# Patient Record
Sex: Female | Born: 1995 | Race: White | Hispanic: No | Marital: Single | Smoking: Never smoker
Health system: Southern US, Community
[De-identification: ages and names within clinical notes are randomized; demographics above are authoritative.]

## PROBLEM LIST (undated history)

## (undated) DIAGNOSIS — F32A Depression, unspecified: Secondary | ICD-10-CM

## (undated) DIAGNOSIS — Z23 Encounter for immunization: Secondary | ICD-10-CM

## (undated) DIAGNOSIS — D164 Benign neoplasm of bones of skull and face: Secondary | ICD-10-CM

## (undated) DIAGNOSIS — F419 Anxiety disorder, unspecified: Secondary | ICD-10-CM

## (undated) DIAGNOSIS — F320 Major depressive disorder, single episode, mild: Secondary | ICD-10-CM

## (undated) DIAGNOSIS — G4730 Sleep apnea, unspecified: Secondary | ICD-10-CM

## (undated) HISTORY — DX: Benign neoplasm of bones of skull and face: D16.4

## (undated) HISTORY — DX: Encounter for immunization: Z23

## (undated) HISTORY — DX: Sleep apnea, unspecified: G47.30

## (undated) HISTORY — DX: Depression, unspecified: F32.A

## (undated) HISTORY — DX: Anxiety disorder, unspecified: F41.9

## (undated) HISTORY — DX: Major depressive disorder, single episode, mild: F32.0

---
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
## 2017-01-01 DIAGNOSIS — F329 Major depressive disorder, single episode, unspecified: Secondary | ICD-10-CM | POA: Diagnosis not present

## 2017-01-12 ENCOUNTER — Telehealth: Payer: Self-pay | Admitting: Physician Assistant

## 2017-01-12 NOTE — Telephone Encounter (Signed)
FYI--Pt refused referral for sleep study

## 2017-01-12 NOTE — Telephone Encounter (Signed)
[id number] 

## 2017-02-12 DIAGNOSIS — F411 Generalized anxiety disorder: Secondary | ICD-10-CM | POA: Diagnosis not present

## 2017-02-26 DIAGNOSIS — F329 Major depressive disorder, single episode, unspecified: Secondary | ICD-10-CM | POA: Diagnosis not present

## 2017-03-10 ENCOUNTER — Encounter: Payer: Self-pay | Admitting: Physician Assistant

## 2017-03-10 ENCOUNTER — Ambulatory Visit (INDEPENDENT_AMBULATORY_CARE_PROVIDER_SITE_OTHER): Payer: Federal, State, Local not specified - PPO | Admitting: Physician Assistant

## 2017-03-10 VITALS — BP 108/78 | HR 84 | Temp 98.3°F | Resp 16 | Wt 128.0 lb

## 2017-03-10 DIAGNOSIS — J069 Acute upper respiratory infection, unspecified: Secondary | ICD-10-CM

## 2017-03-10 DIAGNOSIS — B9789 Other viral agents as the cause of diseases classified elsewhere: Secondary | ICD-10-CM | POA: Diagnosis not present

## 2017-03-10 NOTE — Progress Notes (Signed)
[place]  Chief Complaint  Patient presents with  . URI    Started Saturday    Subjective:    Patient ID: Rhonda Cole, female    DOB: [birth year], 21 y.o.   MRN: [id number]   Upper Respiratory Infection: Rhonda Cole is a 21 y.o. female with a past medical history significant for recurrent ear infection complaining of symptoms of a URI. Symptoms include right ear pain, congestion and cough. Onset of symptoms was 3 days ago, gradually worsening since that time. She also c/o right ear pressure/pain, cough described as productive, lightheadedness and nasal congestion for the past 3 days . No ear discharge. Ear pain is right sided only and intermittent. She is drinking plenty of fluids. Evaluation to date: none. Treatment to date: cough suppressants and decongestants. The treatment has provided minimal.   Review of Systems  Constitutional: Positive for fatigue. Negative for activity change, appetite change, chills, diaphoresis, fever and unexpected weight change.  HENT: Positive for congestion, ear pain (Right ear pain), postnasal drip, rhinorrhea, sinus pain, sinus pressure, sneezing, sore throat and trouble swallowing. Negative for ear discharge, nosebleeds and tinnitus.   Eyes: Positive for discharge. Negative for photophobia, pain, redness, itching and visual disturbance.  Respiratory: Positive for cough and chest tightness. Negative for apnea, shortness of breath and wheezing.   Gastrointestinal: Negative.   Neurological: Positive for light-headedness and headaches. Negative for dizziness.       Objective:   BP 108/78 (BP Location: Left Arm, Patient Position: Sitting, Cuff Size: Normal)   Pulse 84   Temp 98.3 F (36.8 C) (Oral)   Resp 16   Wt 128 lb (58.1 kg)   LMP 02/24/2017   Patient Active Problem List   Diagnosis Date Noted  . 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 citalopram (CELEXA) 20 MG tablet  12/27/2015: Received from: External Pharmacy  . cloNIDine (CATAPRES) 0.1 MG tablet take 1 tablet by mouth if needed for anxiety ATTACK 12/27/2015: Received from: External Pharmacy  . LARIN FE 1.5/30 1.5-30 MG-MCG tablet  12/27/2015: Received from: External Pharmacy   No facility-administered encounter medications on file as of 03/10/2017.     Allergies  Allergen Reactions  . Augmentin [Amoxicillin-Pot Clavulanate] Rash       Physical Exam  Constitutional: She appears well-developed and well-nourished.  HENT:  Right Ear: Tympanic membrane and external ear normal.  Left Ear: Tympanic membrane and external ear normal.  Mouth/Throat: Oropharynx is clear and moist. No oropharyngeal exudate.  Eyes: Right eye exhibits discharge. Left eye exhibits discharge.  Neck: Neck supple.  Cardiovascular: Normal rate and regular rhythm.   Pulmonary/Chest: Effort normal and breath sounds normal. No respiratory distress. She has no rales.  Lymphadenopathy:    She has no cervical adenopathy.  Skin: Skin is warm and dry.  Psychiatric: She has a normal mood and affect. Her behavior is normal.       Assessment & Plan:   1. Viral URI with cough  No ear infection today. May treat symptomatically, gargle with liquid benadryl. Advil for headache.   Recommend rest, fluids, frequent hand washing. Work note provided  Patient Instructions  May gargle with liquid Benadryl, May use Advil as needed for headache.  Upper Respiratory Infection, Adult Most upper respiratory infections (URIs) are caused by a virus. A URI affects the nose, throat, and upper air passages. The most common type of URI is  often called "the common cold." Follow these instructions at home:  Take medicines only as told by your doctor.  Gargle warm saltwater or take cough drops to comfort your throat as told by your doctor.  Use a warm mist humidifier or inhale steam from a  shower to increase air moisture. This may make it easier to breathe.  Drink enough fluid to keep your pee (urine) clear or pale yellow.  Eat soups and other clear broths.  Have a healthy diet.  Rest as needed.  Go back to work when your fever is gone or your doctor says it is okay.  You may need to stay home longer to avoid giving your URI to others.  You can also wear a face mask and wash your hands often to prevent spread of the virus.  Use your inhaler more if you have asthma.  Do not use any tobacco products, including cigarettes, chewing tobacco, or electronic cigarettes. If you need help quitting, ask your doctor. Contact a doctor if:  You are getting worse, not better.  Your symptoms are not helped by medicine.  You have chills.  You are getting more short of breath.  You have brown or red mucus.  You have yellow or brown discharge from your nose.  You have pain in your face, especially when you bend forward.  You have a fever.  You have puffy (swollen) neck glands.  You have pain while swallowing.  You have white areas in the back of your throat. Get help right away if:  You have very bad or constant:  Headache.  Ear pain.  Pain in your forehead, behind your eyes, and over your cheekbones (sinus pain).  Chest pain.  You have long-lasting (chronic) lung disease and any of the following:  Wheezing.  Long-lasting cough.  Coughing up blood.  A change in your usual mucus.  You have a stiff neck.  You have changes in your:  Vision.  Hearing.  Thinking.  Mood. This information is not intended to replace advice given to you by your health care provider. Make sure you discuss any questions you have with your health care provider. Document Released: 06/02/2008 Document Revised: 08/17/2016 Document Reviewed: 03/22/2014 Elsevier Interactive Patient Education  2017 [employer].   The entirety of the information documented in the History of  Present Illness, Review of Systems and Physical Exam were personally obtained by me. Portions of this information were initially documented by Rhonda Cole, CMA and reviewed by me for thoroughness and accuracy.

## 2017-03-10 NOTE — Patient Instructions (Addendum)
May gargle with liquid Benadryl, May use Advil as needed for headache.  Upper Respiratory Infection, Adult Most upper respiratory infections (URIs) are caused by a virus. A URI affects the nose, throat, and upper air passages. The most common type of URI is often called "the common cold." Follow these instructions at home:  Take medicines only as told by your doctor.  Gargle warm saltwater or take cough drops to comfort your throat as told by your doctor.  Use a warm mist humidifier or inhale steam from a shower to increase air moisture. This may make it easier to breathe.  Drink enough fluid to keep your pee (urine) clear or pale yellow.  Eat soups and other clear broths.  Have a healthy diet.  Rest as needed.  Go back to work when your fever is gone or your doctor says it is okay.  You may need to stay home longer to avoid giving your URI to others.  You can also wear a face mask and wash your hands often to prevent spread of the virus.  Use your inhaler more if you have asthma.  Do not use any tobacco products, including cigarettes, chewing tobacco, or electronic cigarettes. If you need help quitting, ask your doctor. Contact a doctor if:  You are getting worse, not better.  Your symptoms are not helped by medicine.  You have chills.  You are getting more short of breath.  You have brown or red mucus.  You have yellow or brown discharge from your nose.  You have pain in your face, especially when you bend forward.  You have a fever.  You have puffy (swollen) neck glands.  You have pain while swallowing.  You have white areas in the back of your throat. Get help right away if:  You have very bad or constant:  Headache.  Ear pain.  Pain in your forehead, behind your eyes, and over your cheekbones (sinus pain).  Chest pain.  You have long-lasting (chronic) lung disease and any of the following:  Wheezing.  Long-lasting cough.  Coughing up  blood.  A change in your usual mucus.  You have a stiff neck.  You have changes in your:  Vision.  Hearing.  Thinking.  Mood. This information is not intended to replace advice given to you by your health care provider. Make sure you discuss any questions you have with your health care provider. Document Released: 06/02/2008 Document Revised: 08/17/2016 Document Reviewed: 03/22/2014 Elsevier Interactive Patient Education  2017 [employer].

## 2017-03-12 DIAGNOSIS — F411 Generalized anxiety disorder: Secondary | ICD-10-CM | POA: Diagnosis not present

## 2017-03-21 DIAGNOSIS — J00 Acute nasopharyngitis [common cold]: Secondary | ICD-10-CM | POA: Diagnosis not present

## 2017-04-02 DIAGNOSIS — F329 Major depressive disorder, single episode, unspecified: Secondary | ICD-10-CM | POA: Diagnosis not present

## 2017-04-07 DIAGNOSIS — F329 Major depressive disorder, single episode, unspecified: Secondary | ICD-10-CM | POA: Diagnosis not present

## 2017-04-23 DIAGNOSIS — F329 Major depressive disorder, single episode, unspecified: Secondary | ICD-10-CM | POA: Diagnosis not present

## 2017-05-11 DIAGNOSIS — F329 Major depressive disorder, single episode, unspecified: Secondary | ICD-10-CM | POA: Diagnosis not present

## 2017-05-28 DIAGNOSIS — F329 Major depressive disorder, single episode, unspecified: Secondary | ICD-10-CM | POA: Diagnosis not present

## 2017-06-09 DIAGNOSIS — F411 Generalized anxiety disorder: Secondary | ICD-10-CM | POA: Diagnosis not present

## 2017-06-17 ENCOUNTER — Telehealth: Payer: Self-pay | Admitting: Obstetrics and Gynecology

## 2017-06-17 MED ORDER — NORETHIN ACE-ETH ESTRAD-FE 1.5-30 MG-MCG PO TABS
1.0000 | ORAL_TABLET | Freq: Every day | ORAL | 0 refills | Status: DC
Start: 2017-06-17 — End: 2017-07-14

## 2017-06-17 NOTE — Telephone Encounter (Signed)
rx sent to [facility] [address]

## 2017-06-17 NOTE — Telephone Encounter (Signed)
Patient needs refill on bc to make it to her next appt, only has a week left, scheduled for annual 7/17 with [facility] (last annual 7/11).  [employer].

## 2017-06-18 DIAGNOSIS — F411 Generalized anxiety disorder: Secondary | ICD-10-CM | POA: Diagnosis not present

## 2017-06-25 DIAGNOSIS — F329 Major depressive disorder, single episode, unspecified: Secondary | ICD-10-CM | POA: Diagnosis not present

## 2017-06-25 DIAGNOSIS — F411 Generalized anxiety disorder: Secondary | ICD-10-CM | POA: Diagnosis not present

## 2017-06-29 DIAGNOSIS — F329 Major depressive disorder, single episode, unspecified: Secondary | ICD-10-CM | POA: Diagnosis not present

## 2017-07-14 ENCOUNTER — Ambulatory Visit (INDEPENDENT_AMBULATORY_CARE_PROVIDER_SITE_OTHER): Payer: Federal, State, Local not specified - PPO | Admitting: Obstetrics and Gynecology

## 2017-07-14 ENCOUNTER — Encounter: Payer: Self-pay | Admitting: Obstetrics and Gynecology

## 2017-07-14 VITALS — BP 124/76 | HR 96 | Ht 66.0 in | Wt 128.0 lb

## 2017-07-14 DIAGNOSIS — N946 Dysmenorrhea, unspecified: Secondary | ICD-10-CM

## 2017-07-14 DIAGNOSIS — Z01419 Encounter for gynecological examination (general) (routine) without abnormal findings: Secondary | ICD-10-CM | POA: Diagnosis not present

## 2017-07-14 DIAGNOSIS — Z3041 Encounter for surveillance of contraceptive pills: Secondary | ICD-10-CM | POA: Diagnosis not present

## 2017-07-14 MED ORDER — NORETHIN ACE-ETH ESTRAD-FE 1.5-30 MG-MCG PO TABS: 1.0000 | ORAL_TABLET | Freq: Every day | ORAL | 12 refills | Status: DC

## 2017-07-14 NOTE — Progress Notes (Signed)
Chief Complaint  Patient presents with  . Gynecologic Exam    refill on ocp     HPI:      [name] is a 21 y.o. G0P0000 who LMP was Patient's last menstrual period was 07/14/2017., presents today for her annual examination.  Her menses are regular every 28-30 days, lasting3-5 days.  Dysmenorrhea mild, occurring first 1-2 days of flow.She started OCPs for dysmen and cycle control a couple yrs ago. Cramps improved with OCPs, but not resolved. She does not have intermenstrual bleeding.  Sex activity: never  There is a FH of breast cancer in her mat grt aunt, genetic testing not indicated. There is no FH of ovarian cancer. The patient does do self-breast exams.  Tobacco use: The patient denies current or previous tobacco use. Alcohol use: none Exercise: moderately active  She does get adequate calcium and Vitamin D in her diet.  She has completed [place] vaccine series.   Past Medical History:  Diagnosis Date  . Anxiety   . Mild depression (Short)     Past Surgical History:  Procedure Laterality Date  . HERNIA REPAIR  1999   double when she was a baby  . TYMPANOSTOMY TUBE PLACEMENT  2000   x2    Family History  Problem Relation Age of Onset  . Migraines Mother   . Hyperlipidemia Father   . Hypertension Father   . Arthritis Father   . Asthma Sister   . Alzheimer's disease Maternal Grandmother   . Arthritis Maternal Grandmother   . Hyperlipidemia Paternal Grandfather   . Hypertension Paternal Grandfather   . Cancer Paternal Grandfather        COLON; LUNG; LYMPHOMA; ?  . Heart failure Paternal Grandfather   . Heart attack Paternal Grandfather   . Cancer Other        BREAST    Social History   Social History  . Marital status: Single    Spouse name: N/A  . Number of children: N/A  . Years of education: N/A   Occupational History  . Not on file.   Social History Main Topics  . Smoking status: Never Smoker  . Smokeless tobacco: Never Used  . Alcohol  use No  . Drug use: No  . Sexual activity: No   Other Topics Concern  . Not on file   Social History Narrative  . No narrative on file     Current Outpatient Prescriptions:  .  citalopram (CELEXA) 20 MG tablet, Take 20 mg by mouth daily., Disp: , Rfl:  .  cloNIDine (CATAPRES) 0.1 MG tablet, take 1 tablet by mouth if needed for anxiety ATTACK, Disp: , Rfl: 0 .  norethindrone-ethinyl estradiol-iron (LARIN FE 1.5/30) 1.5-30 MG-MCG tablet, Take 1 tablet by mouth daily., Disp: 1 Package, Rfl: 12  ROS:  Review of Systems  Constitutional: Negative for fatigue, fever and unexpected weight change.  Respiratory: Negative for cough, shortness of breath and wheezing.   Cardiovascular: Negative for chest pain, palpitations and leg swelling.  Gastrointestinal: Negative for blood in stool, constipation, diarrhea, nausea and vomiting.  Endocrine: Negative for cold intolerance, heat intolerance and polyuria.  Genitourinary: Negative for dyspareunia, dysuria, flank pain, frequency, genital sores, hematuria, menstrual problem, pelvic pain, urgency, vaginal bleeding, vaginal discharge and vaginal pain.  Musculoskeletal: Negative for back pain, joint swelling and myalgias.  Skin: Negative for rash.  Neurological: Negative for dizziness, syncope, light-headedness, numbness and headaches.  Hematological: Negative for adenopathy.  Psychiatric/Behavioral: Negative for agitation, confusion,  sleep disturbance and suicidal ideas. The patient is not nervous/anxious.      Objective: BP 124/76 (BP Location: Left Arm, Patient Position: Sitting, Cuff Size: Normal)   Pulse 96   Ht 5\' 6"  (1.676 m)   Wt 128 lb (58.1 kg)   LMP 07/14/2017   BMI 20.66 kg/m    Physical Exam  Constitutional: She is oriented to person, place, and time. She appears well-developed and well-nourished.  Neck: Normal range of motion. No thyromegaly present.  Cardiovascular: Normal rate, regular rhythm and normal heart sounds.   No  murmur heard. Pulmonary/Chest: Effort normal and breath sounds normal. Right breast exhibits no mass, no nipple discharge, no skin change and no tenderness. Left breast exhibits no mass, no nipple discharge, no skin change and no tenderness.  Abdominal: Soft. There is no tenderness. There is no guarding.  Musculoskeletal: Normal range of motion.  Neurological: She is alert and oriented to person, place, and time. No cranial nerve deficit.  Psychiatric: She has a normal mood and affect. Her behavior is normal.  Vitals reviewed. GYN EXAM DEFERRED SINCE NEVER SEX ACTIVE   Assessment/Plan: Encounter for annual routine gynecological examination  Encounter for surveillance of contraceptive pills - OCP RF.  - Plan: norethindrone-ethinyl estradiol-iron (LARIN FE 1.5/30) 1.5-30 MG-MCG tablet  Dysmenorrhea - Improved with OCPs.             GYN counsel adequate intake of calcium and vitamin D     F/U  Return in about 1 year (around 07/14/2018).  Warner Laduca B. Sapphire Tygart, PA-C 07/14/2017 4:31 PM

## 2017-07-15 DIAGNOSIS — F411 Generalized anxiety disorder: Secondary | ICD-10-CM | POA: Diagnosis not present

## 2017-07-21 DIAGNOSIS — F3341 Major depressive disorder, recurrent, in partial remission: Secondary | ICD-10-CM | POA: Diagnosis not present

## 2017-07-21 DIAGNOSIS — F411 Generalized anxiety disorder: Secondary | ICD-10-CM | POA: Diagnosis not present

## 2017-07-21 DIAGNOSIS — F422 Mixed obsessional thoughts and acts: Secondary | ICD-10-CM | POA: Diagnosis not present

## 2017-08-04 DIAGNOSIS — F411 Generalized anxiety disorder: Secondary | ICD-10-CM | POA: Diagnosis not present

## 2017-08-04 DIAGNOSIS — F422 Mixed obsessional thoughts and acts: Secondary | ICD-10-CM | POA: Diagnosis not present

## 2017-08-04 DIAGNOSIS — F3341 Major depressive disorder, recurrent, in partial remission: Secondary | ICD-10-CM | POA: Diagnosis not present

## 2017-08-06 DIAGNOSIS — K080 Exfoliation of teeth due to systemic causes: Secondary | ICD-10-CM | POA: Diagnosis not present

## 2017-09-03 DIAGNOSIS — F411 Generalized anxiety disorder: Secondary | ICD-10-CM | POA: Diagnosis not present

## 2017-09-17 DIAGNOSIS — F411 Generalized anxiety disorder: Secondary | ICD-10-CM | POA: Diagnosis not present

## 2017-09-17 DIAGNOSIS — F330 Major depressive disorder, recurrent, mild: Secondary | ICD-10-CM | POA: Diagnosis not present

## 2017-09-17 DIAGNOSIS — F5105 Insomnia due to other mental disorder: Secondary | ICD-10-CM | POA: Diagnosis not present

## 2017-09-24 DIAGNOSIS — F329 Major depressive disorder, single episode, unspecified: Secondary | ICD-10-CM | POA: Diagnosis not present

## 2017-09-25 ENCOUNTER — Encounter: Payer: Self-pay | Admitting: Physician Assistant

## 2017-09-25 ENCOUNTER — Ambulatory Visit (INDEPENDENT_AMBULATORY_CARE_PROVIDER_SITE_OTHER): Payer: Federal, State, Local not specified - PPO | Admitting: Physician Assistant

## 2017-09-25 VITALS — BP 118/70 | HR 77 | Temp 98.5°F | Resp 16 | Wt 124.8 lb

## 2017-09-25 DIAGNOSIS — D369 Benign neoplasm, unspecified site: Secondary | ICD-10-CM

## 2017-09-25 DIAGNOSIS — H00022 Hordeolum internum right lower eyelid: Secondary | ICD-10-CM | POA: Diagnosis not present

## 2017-09-25 NOTE — Patient Instructions (Addendum)
Dermoid/Epidermal Cyst An epidermal cyst is sometimes called an epidermal inclusion cyst or an infundibular cyst. It is a sac made of skin tissue. The sac contains a substance called keratin. Keratin is a protein that is normally secreted through the hair follicles. When keratin becomes trapped in the top layer of skin (epidermis), it can form an epidermal cyst. Epidermal cysts are usually found on the face, neck, trunk, and genitals. These cysts are usually harmless (benign), and they may not cause symptoms unless they become infected. It is important not to pop epidermal cysts yourself. What are the causes? This condition may be caused by:  A blocked hair follicle.  A hair that curls and re-enters the skin instead of growing straight out of the skin (ingrown hair).  A blocked pore.  Irritated skin.  An injury to the skin.  Certain conditions that are passed along from parent to child (inherited).  Human papillomavirus (HPV).  What increases the risk? The following factors may make you more likely to develop an epidermal cyst:  Having acne.  Being overweight.  Wearing tight clothing.  What are the signs or symptoms? The only symptom of this condition may be a small, painless lump underneath the skin. When an epidermal cyst becomes infected, symptoms may include:  Redness.  Inflammation.  Tenderness.  Warmth.  Fever.  Keratin draining from the cyst. Keratin may look like a grayish-white, bad-smelling substance.  Pus draining from the cyst.  How is this diagnosed? This condition is diagnosed with a physical exam. In some cases, you may have a sample of tissue (biopsy) taken from your cyst to be examined under a microscope or tested for bacteria. You may be referred to a health care provider who specializes in skin care (dermatologist). How is this treated? In many cases, epidermal cysts go away on their own without treatment. If a cyst becomes infected, treatment may  include:  Opening and draining the cyst. After draining, minor surgery to remove the rest of the cyst may be done.  Antibiotic medicine to help prevent infection.  Injections of medicines (steroids) that help to reduce inflammation.  Surgery to remove the cyst. Surgery may be done if: ? The cyst becomes large. ? The cyst bothers you. ? There is a chance that the cyst could turn into cancer.  Follow these instructions at home:  Take over-the-counter and prescription medicines only as told by your health care provider.  If you were prescribed an antibiotic, use it as told by your health care provider. Do not stop using the antibiotic even if you start to feel better.  Keep the area around your cyst clean and dry.  Wear loose, dry clothing.  Do not try to pop your cyst.  Avoid touching your cyst.  Check your cyst every day for signs of infection.  Keep all follow-up visits as told by your health care provider. This is important. How is this prevented?  Wear clean, dry, clothing.  Avoid wearing tight clothing.  Keep your skin clean and dry. Shower or take baths every day.  Wash your body with a benzoyl peroxide wash when you shower or bathe. Contact a health care provider if:  Your cyst develops symptoms of infection.  Your condition is not improving or is getting worse.  You develop a cyst that looks different from other cysts you have had.  You have a fever. Get help right away if:  Redness spreads from the cyst into the surrounding area. This information is  not intended to replace advice given to you by your health care provider. Make sure you discuss any questions you have with your health care provider. Document Released: 11/15/2004 Document Revised: 08/13/2016 Document Reviewed: 10/17/2015 Elsevier Interactive Patient Education  2018 [place] is a bump on your eyelid caused by a bacterial infection. A stye can form inside the eyelid (internal  stye) or outside the eyelid (external stye). An internal stye may be caused by an infected oil-producing gland inside your eyelid. An external stye may be caused by an infection at the base of your eyelash (hair follicle). Styes are very common. Anyone can get them at any age. They usually occur in just one eye, but you may have more than one in either eye. What are the causes? The infection is almost always caused by bacteria called Staphylococcus aureus. This is a common type of bacteria that lives on your skin. What increases the risk? You may be at higher risk for a stye if you have had one before. You may also be at higher risk if you have:  Diabetes.  Long-term illness.  Long-term eye redness.  A skin condition called seborrhea.  High fat levels in your blood (lipids).  What are the signs or symptoms? Eyelid pain is the most common symptom of a stye. Internal styes are more painful than external styes. Other signs and symptoms may include:  Painful swelling of your eyelid.  A scratchy feeling in your eye.  Tearing and redness of your eye.  Pus draining from the stye.  How is this diagnosed? Your health care provider may be able to diagnose a stye just by examining your eye. The health care provider may also check to make sure:  You do not have a fever or other signs of a more serious infection.  The infection has not spread to other parts of your eye or areas around your eye.  How is this treated? Most styes will clear up in a few days without treatment. In some cases, you may need to use antibiotic drops or ointment to prevent infection. Your health care provider may have to drain the stye surgically if your stye is:  Large.  Causing a lot of pain.  Interfering with your vision.  This can be done using a thin blade or a needle. Follow these instructions at home:  Take medicines only as directed by your health care provider.  Apply a clean, warm compress to your  eye for 10 minutes, 4 times a day.  Do not wear contact lenses or eye makeup until your stye has healed.  Do not try to pop or drain the stye. Contact a health care provider if:  You have chills or a fever.  Your stye does not go away after several days.  Your stye affects your vision.  Your eyeball becomes swollen, red, or painful. This information is not intended to replace advice given to you by your health care provider. Make sure you discuss any questions you have with your health care provider. Document Released: 09/24/2005 Document Revised: 08/10/2016 Document Reviewed: 03/31/2014 Elsevier Interactive Patient Education  [employer].

## 2017-09-25 NOTE — Progress Notes (Signed)
Patient: Rhonda Cole Female    DOB: 1996-05-30   21 y.o.   MRN: [id number] Visit Date: 09/25/2017  Today's Provider: Mar Daring, PA-C   Chief Complaint  Patient presents with  . Eye Pain   Subjective:    HPI Patient is here today with c/o pain on right eye, lower part. Patient reports it started a week ago. No injury, redness, discharge, or swelling. Denies any visual changes. States she gets a shooting pain when she pulls her lower lid down. Has had styes in the past.   Flu Vaccine offered per patient will wait until she comes in with her mother.    Allergies  Allergen Reactions  . Augmentin [Amoxicillin-Pot Clavulanate] Rash     Current Outpatient Prescriptions:  .  citalopram (CELEXA) 40 MG tablet, Take 40 mg by mouth every morning., Disp: , Rfl: 0 .  cloNIDine (CATAPRES) 0.1 MG tablet, take 1 tablet by mouth if needed for anxiety ATTACK, Disp: , Rfl: 0 .  norethindrone-ethinyl estradiol-iron (BLISOVI FE 1.5/30) 1.5-30 MG-MCG tablet, Take 1 tablet by mouth daily., Disp: , Rfl:   Review of Systems  Constitutional: Negative.   Eyes: Positive for pain (not of eye, but when pulling lower lid down). Negative for photophobia, discharge, redness, itching and visual disturbance.  Respiratory: Negative.   Cardiovascular: Negative.   Neurological: Negative for dizziness and headaches.    Social History  Substance Use Topics  . Smoking status: Never Smoker  . Smokeless tobacco: Never Used  . Alcohol use No   Objective:   BP 118/70 (BP Location: Left Arm, Patient Position: Sitting, Cuff Size: Normal)   Pulse 77   Temp 98.5 F (36.9 C) (Oral)   Resp 16   Wt 124 lb 12.8 oz (56.6 kg)   BMI 20.14 kg/m    Physical Exam  Constitutional: She appears well-developed and well-nourished. No distress.  HENT:  Head: Normocephalic and atraumatic.    Right Ear: Hearing, tympanic membrane, external ear and ear canal normal.  Left Ear: Hearing, tympanic membrane,  external ear and ear canal normal.  Nose: Nose normal.  Mouth/Throat: Uvula is midline, oropharynx is clear and moist and mucous membranes are normal. No oropharyngeal exudate.  Eyes: Pupils are equal, round, and reactive to light. Conjunctivae and EOM are normal. Right eye exhibits hordeolum (very small bump noted on internal aspect just past lower tear duct). Right eye exhibits no chemosis, no discharge and no exudate. No foreign body present in the right eye. Left eye exhibits no discharge. No scleral icterus.  Neck: Normal range of motion. Neck supple. No tracheal deviation present. No thyromegaly present.  Cardiovascular: Normal rate, regular rhythm and normal heart sounds.  Exam reveals no gallop and no friction rub.   No murmur heard. Pulmonary/Chest: Effort normal and breath sounds normal. No stridor. No respiratory distress. She has no wheezes. She has no rales.  Lymphadenopathy:    She has no cervical adenopathy.  Skin: Skin is warm and dry. She is not diaphoretic.  Vitals reviewed.       Assessment & Plan:     1. Hordeolum internum of right lower eyelid Advised of conservative therapy with warm compresses and wetting drops if needed. She is to call if no improvement in 1 week. Will refer to ophthalmology if no improvement.   2. Dermoid cyst Watchful monitoring. She is to call if it increases in size or becomes more symptomatic. Has some pain occasionally if her glasses  press on it. Will refer to dermatology if symptoms worsen.       Mar Daring, PA-C  [facility]

## 2017-10-05 DIAGNOSIS — F330 Major depressive disorder, recurrent, mild: Secondary | ICD-10-CM | POA: Diagnosis not present

## 2017-10-05 DIAGNOSIS — F411 Generalized anxiety disorder: Secondary | ICD-10-CM | POA: Diagnosis not present

## 2017-10-05 DIAGNOSIS — F5105 Insomnia due to other mental disorder: Secondary | ICD-10-CM | POA: Diagnosis not present

## 2017-10-08 DIAGNOSIS — F329 Major depressive disorder, single episode, unspecified: Secondary | ICD-10-CM | POA: Diagnosis not present

## 2017-11-02 DIAGNOSIS — F411 Generalized anxiety disorder: Secondary | ICD-10-CM | POA: Diagnosis not present

## 2017-11-12 DIAGNOSIS — F411 Generalized anxiety disorder: Secondary | ICD-10-CM | POA: Diagnosis not present

## 2017-11-23 DIAGNOSIS — F330 Major depressive disorder, recurrent, mild: Secondary | ICD-10-CM | POA: Diagnosis not present

## 2017-11-23 DIAGNOSIS — F5105 Insomnia due to other mental disorder: Secondary | ICD-10-CM | POA: Diagnosis not present

## 2017-11-23 DIAGNOSIS — F411 Generalized anxiety disorder: Secondary | ICD-10-CM | POA: Diagnosis not present

## 2017-11-27 DIAGNOSIS — F411 Generalized anxiety disorder: Secondary | ICD-10-CM | POA: Diagnosis not present

## 2017-12-10 DIAGNOSIS — F411 Generalized anxiety disorder: Secondary | ICD-10-CM | POA: Diagnosis not present

## 2017-12-24 ENCOUNTER — Encounter: Payer: Self-pay | Admitting: Physician Assistant

## 2017-12-24 ENCOUNTER — Ambulatory Visit (INDEPENDENT_AMBULATORY_CARE_PROVIDER_SITE_OTHER): Payer: Federal, State, Local not specified - PPO | Admitting: Physician Assistant

## 2017-12-24 VITALS — BP 116/64 | HR 92 | Temp 98.5°F | Resp 16 | Wt 121.0 lb

## 2017-12-24 DIAGNOSIS — R058 Other specified cough: Secondary | ICD-10-CM

## 2017-12-24 DIAGNOSIS — R0782 Intercostal pain: Secondary | ICD-10-CM | POA: Diagnosis not present

## 2017-12-24 DIAGNOSIS — F411 Generalized anxiety disorder: Secondary | ICD-10-CM | POA: Diagnosis not present

## 2017-12-24 DIAGNOSIS — J358 Other chronic diseases of tonsils and adenoids: Secondary | ICD-10-CM

## 2017-12-24 DIAGNOSIS — G478 Other sleep disorders: Secondary | ICD-10-CM

## 2017-12-24 DIAGNOSIS — R05 Cough: Secondary | ICD-10-CM

## 2017-12-24 DIAGNOSIS — R0981 Nasal congestion: Secondary | ICD-10-CM

## 2017-12-24 MED ORDER — MAGIC MOUTHWASH W/LIDOCAINE: 5.0000 mL | Freq: Three times a day (TID) | ORAL | 0 refills | Status: DC | PRN

## 2017-12-24 NOTE — Progress Notes (Signed)
Patient: Rhonda Cole Female    DOB: 05/17/1996   21 y.o.   MRN: [id number] Visit Date: 12/25/2017  Today's Provider: Trinna Post, PA-C   Chief Complaint  Patient presents with  . Sore Throat   Subjective:    Rhonda Cole is a 21 y/o woman presenting here today for sore throat, chest pain, and sleep paralysis.   She says she has had a sore throat for a week. Has had history of aphthous ulcers and has one now. She reports pain when swallowing.   She also reports a right sided chest pain that went on for two days but then resolved. She has been coughing frequently. No injuries. No trouble breathing. No n/v or diaphoresis.   Also reports two episodes where she woke up for a dream at night and couldn't move for 30 seconds.  Sore Throat   This is a new problem. The pain is worse on the left side. Associated symptoms include congestion, coughing, a plugged ear sensation and swollen glands. Pertinent negatives include no abdominal pain, ear discharge, ear pain, headaches, hoarse voice, shortness of breath, stridor, trouble swallowing or vomiting.       Allergies  Allergen Reactions  . Augmentin [Amoxicillin-Pot Clavulanate] Rash     Current Outpatient Medications:  .  citalopram (CELEXA) 40 MG tablet, Take 40 mg by mouth every morning., Disp: , Rfl: 0 .  cloNIDine (CATAPRES) 0.1 MG tablet, take 1 tablet by mouth if needed for anxiety ATTACK, Disp: , Rfl: 0 .  norethindrone-ethinyl estradiol-iron (BLISOVI FE 1.5/30) 1.5-30 MG-MCG tablet, Take 1 tablet by mouth daily., Disp: , Rfl:  .  AFLURIA QUADRIVALENT 0.5 ML injection, inject 0.5 milliliter intramuscularly, Disp: , Rfl: 0 .  BOOSTRIX 5-2.5-18.5 LF-MCG/0.5 injection, inject 0.5 milliliter intramuscularly, Disp: , Rfl: 0 .  hydrOXYzine (ATARAX/VISTARIL) 25 MG tablet, take 1-2 tablets by mouth at bedtime if needed for anxiety, Disp: , Rfl: 0 .  magic mouthwash w/lidocaine SOLN, Take 5 mLs by mouth 3 (three) times daily  as needed for mouth pain., Disp: 240 mL, Rfl: 0  Review of Systems  Constitutional: Positive for fatigue. Negative for activity change, appetite change, chills, diaphoresis, fever and unexpected weight change.  HENT: Positive for congestion, postnasal drip, sore throat and tinnitus. Negative for ear discharge, ear pain, hoarse voice, nosebleeds, rhinorrhea, sinus pressure, sinus pain, trouble swallowing and voice change.   Respiratory: Positive for cough and chest tightness. Negative for apnea, choking, shortness of breath, wheezing and stridor.   Gastrointestinal: Negative.  Negative for abdominal pain and vomiting.  Neurological: Negative for dizziness, light-headedness and headaches.    Social History   Tobacco Use  . Smoking status: Never Smoker  . Smokeless tobacco: Never Used  Substance Use Topics  . Alcohol use: No   Objective:   BP 116/64 (BP Location: Right Arm, Patient Position: Sitting, Cuff Size: Normal)   Pulse 92   Temp 98.5 F (36.9 C) (Oral)   Resp 16   Wt 121 lb (54.9 kg)   LMP 12/03/2017   BMI 19.53 kg/m  Vitals:   12/24/17 1132  BP: 116/64  Pulse: 92  Resp: 16  Temp: 98.5 F (36.9 C)  TempSrc: Oral  Weight: 121 lb (54.9 kg)     Physical Exam  Constitutional: She is oriented to person, place, and time. She appears well-developed and well-nourished.  HENT:  Right Ear: External ear normal.  Left Ear: External ear normal.  Mouth/Throat:  Eyes: Conjunctivae are normal.  Neck: Neck supple.  Cardiovascular: Normal rate and regular rhythm.  Pulmonary/Chest: Effort normal and breath sounds normal.  Abdominal: Soft.  Lymphadenopathy:    She has no cervical adenopathy.  Neurological: She is alert and oriented to person, place, and time.  Skin: Skin is warm and dry.  Psychiatric: She has a normal mood and affect. Her behavior is normal.        Assessment & Plan:     1. Aphthous ulcer of tonsil  - magic mouthwash w/lidocaine SOLN; Take 5 mLs by  mouth 3 (three) times daily as needed for mouth pain.  Dispense: 240 mL; Refill: 0  2. Intercostal pain  Likely MSK related. Resolved now.  3. Sleep paralysis  Reassurance provided.  4. Cough with congestion of paranasal sinus  May Cole taking daily allergy medication like Allegra or Zyrtec. Flonase as well.  Return if symptoms worsen or fail to improve.  The entirety of the information documented in the History of Present Illness, Review of Systems and Physical Exam were personally obtained by me. Portions of this information were initially documented by Ashley Royalty, CMA and reviewed by me for thoroughness and accuracy.          Trinna Post, PA-C  [facility]

## 2017-12-24 NOTE — Patient Instructions (Signed)
Please start taking daily allergy pill like Zyrtec or Allegra.  Oral Ulcers Oral ulcers are sores inside the mouth or near the mouth. They may be called canker sores or cold sores, which are two types of oral ulcers. Many oral ulcers are harmless and go away on their own. In some cases, oral ulcers may require medical care to determine the cause and proper treatment. What are the causes? Common causes of this condition include:  Viral, bacterial, or fungal infection.  Emotional stress.  Foods or chemicals that irritate the mouth.  Injury or physical irritation of the mouth.  Medicines.  Allergies.  Tobacco use.  Less common causes include:  Skin disease.  A type of herpes virus infection (herpes simplexor herpes zoster).  Oral cancer.  In some cases, the cause of this condition may not be known. What increases the risk? Oral ulcers are more likely to develop in:  People who wear dental braces, dentures, or retainers.  People who do not keep their mouth clean or brush their teeth regularly.  People who have sensitive skin.  People who have conditions that affect the entire body (systemic conditions), such as immune disorders.  What are the signs or symptoms? The main symptom of this condition is one or more oval-shaped or round ulcers that have red borders. Details about symptoms may vary depending on the cause.  Location of the ulcers. They may be inside the mouth, on the gums, or on the insides of the lips or cheeks. They may also be on the lips or on skin that is near the mouth, such as the cheeks and chin.  Pain. Ulcers can be painful and uncomfortable, or they can be painless.  Appearance of the ulcers. They may look like red blisters and be filled with fluid, or they may be white or yellow patches.  Frequency of outbreaks. Ulcers may go away permanently after one outbreak, or they may come back (recur) often or rarely.  How is this diagnosed? This condition  is diagnosed with a physical exam. Your health care provider may ask you questions about your lifestyle and your medical history. You may have tests, including:  Blood tests.  Removal of a small number of cells from an ulcer to be examined under a microscope (biopsy).  How is this treated? This condition is treated by managing any pain and discomfort, and by treating the underlying cause of the ulcers, if necessary. Usually, oral ulcers resolve by themselves in 1-2 weeks. You may be told to keep your mouth clean and avoid things that cause or irritate your ulcers. Your health care provider may prescribe medicines to reduce pain and discomfort or treat the underlying cause, if this applies. Follow these instructions at home: Lifestyle  Follow instructions from your health care provider about eating or drinking restrictions. ? Drink enough fluid to keep your urine clear or pale yellow. ? Avoid foods and drinks that irritate your ulcers.  Avoid tobacco products, including cigarettes, chewing tobacco, or e-cigarettes. If you need help quitting, ask your health care provider.  Avoid excessive alcohol use. Oral Hygiene  Avoid physical or chemical irritants that may have caused the ulcers or made them worse, such as mouthwashes that contain alcohol (ethanol). If you wear dental braces, dentures, or retainers, work with your health care provider to make sure these devices are fitted correctly.  Brush and floss your teeth at least once every day, and get regular dental cleanings and checkups.  Gargle with a salt-water  mixture 3-4 times per day or as told by your health care provider. To make a salt-water mixture, completely dissolve -1 tsp of salt in 1 cup of warm water. General instructions  Take over-the-counter and prescription medicines only as told by your health care provider.  If you have pain, wrap a cold compress in a towel and gently press it against your face to help reduce  pain.  Keep all follow-up visits as told by your health care provider. This is important. Contact a health care provider if:  You have pain that gets worse or does not get better with medicine.  You have 4 or more ulcers at one time.  You have a fever.  You have new ulcers that look or feel different from other ulcers you have.  You have inflammation in one eye or both eyes.  You have ulcers that do not go away after 10 days.  You develop new symptoms in your mouth, such as: ? Bleeding or crusting around your lips or gums. ? Tooth pain. ? Difficulty swallowing.  You develop symptoms on your skin or genitals, such as: ? A rash or blisters. ? Burning or itching sensations.  Your ulcers begin or get worse after you start a new medicine. Get help right away if:  You have difficulty breathing.  You have swelling in your face or neck.  You have excessive bleeding from your mouth.  You have severe pain. This information is not intended to replace advice given to you by your health care provider. Make sure you discuss any questions you have with your health care provider. Document Released: 01/22/2005 Document Revised: 05/19/2016 Document Reviewed: 05/02/2015 Elsevier Interactive Patient Education  [employer].

## 2018-01-05 ENCOUNTER — Ambulatory Visit: Payer: Self-pay | Admitting: Physician Assistant

## 2018-01-05 DIAGNOSIS — F411 Generalized anxiety disorder: Secondary | ICD-10-CM | POA: Diagnosis not present

## 2018-01-06 ENCOUNTER — Ambulatory Visit (INDEPENDENT_AMBULATORY_CARE_PROVIDER_SITE_OTHER): Payer: Federal, State, Local not specified - PPO | Admitting: Physician Assistant

## 2018-01-06 ENCOUNTER — Encounter: Payer: Self-pay | Admitting: Physician Assistant

## 2018-01-06 VITALS — BP 112/70 | HR 105 | Temp 98.6°F | Resp 16 | Wt 118.4 lb

## 2018-01-06 DIAGNOSIS — H60391 Other infective otitis externa, right ear: Secondary | ICD-10-CM | POA: Diagnosis not present

## 2018-01-06 DIAGNOSIS — H6991 Unspecified Eustachian tube disorder, right ear: Secondary | ICD-10-CM

## 2018-01-06 DIAGNOSIS — J069 Acute upper respiratory infection, unspecified: Secondary | ICD-10-CM

## 2018-01-06 DIAGNOSIS — H6981 Other specified disorders of Eustachian tube, right ear: Secondary | ICD-10-CM | POA: Diagnosis not present

## 2018-01-06 MED ORDER — FLUTICASONE PROPIONATE 50 MCG/ACT NA SUSP: 2.0000 | Freq: Every day | NASAL | 6 refills | Status: DC

## 2018-01-06 MED ORDER — DOXYCYCLINE HYCLATE 100 MG PO TABS: 100.0000 mg | ORAL_TABLET | Freq: Two times a day (BID) | ORAL | 0 refills | Status: DC

## 2018-01-06 NOTE — Progress Notes (Signed)
Patient: Rhonda Cole Female    DOB: 05-03-1996   22 y.o.   MRN: [id number] Visit Date: 01/06/2018  Today's Provider: Mar Daring, PA-C   Chief Complaint  Patient presents with  . Otalgia   Subjective:    Otalgia   There is pain in the right ear. This is a new (a week in a half ago) problem. The current episode started 1 to 4 weeks ago. The problem has been gradually improving. There has been no fever. The patient is experiencing no pain. Associated symptoms include coughing. Pertinent negatives include no ear discharge, headaches, rhinorrhea or sore throat. Associated symptoms comments: She feels like her ear is plugged in. She has tried nothing for the symptoms.      Allergies  Allergen Reactions  . Augmentin [Amoxicillin-Pot Clavulanate] Rash     Current Outpatient Medications:  .  citalopram (CELEXA) 40 MG tablet, Take 40 mg by mouth every morning., Disp: , Rfl: 0 .  hydrOXYzine (ATARAX/VISTARIL) 25 MG tablet, take 1-2 tablets by mouth at bedtime if needed for anxiety, Disp: , Rfl: 0 .  norethindrone-ethinyl estradiol-iron (BLISOVI FE 1.5/30) 1.5-30 MG-MCG tablet, Take 1 tablet by mouth daily., Disp: , Rfl:  .  AFLURIA QUADRIVALENT 0.5 ML injection, inject 0.5 milliliter intramuscularly, Disp: , Rfl: 0 .  BOOSTRIX 5-2.5-18.5 LF-MCG/0.5 injection, inject 0.5 milliliter intramuscularly, Disp: , Rfl: 0 .  cloNIDine (CATAPRES) 0.1 MG tablet, take 1 tablet by mouth if needed for anxiety ATTACK, Disp: , Rfl: 0 .  magic mouthwash w/lidocaine SOLN, Take 5 mLs by mouth 3 (three) times daily as needed for mouth pain. (Patient not taking: Reported on 01/06/2018), Disp: 240 mL, Rfl: 0  Review of Systems  Constitutional: Negative for fatigue and fever.  HENT: Positive for congestion, ear pain, postnasal drip and sinus pressure. Negative for ear discharge, rhinorrhea, sinus pain, sore throat and trouble swallowing.   Respiratory: Positive for cough. Negative for chest  tightness, shortness of breath and wheezing.   Cardiovascular: Negative for chest pain, palpitations and leg swelling.  Neurological: Negative for headaches.    Social History   Tobacco Use  . Smoking status: Never Smoker  . Smokeless tobacco: Never Used  Substance Use Topics  . Alcohol use: No   Objective:   BP 112/70 (BP Location: Left Arm, Patient Position: Sitting, Cuff Size: Normal)   Pulse (!) 105   Temp 98.6 F (37 C) (Oral)   Resp 16   Wt 118 lb 6.4 oz (53.7 kg)   LMP 12/29/2016   SpO2 98%   BMI 19.11 kg/m    Physical Exam  Constitutional: She appears well-developed and well-nourished. No distress.  HENT:  Head: Normocephalic and atraumatic.  Right Ear: Hearing and ear canal normal. There is tenderness (erythematous canal). A middle ear effusion is present.  Left Ear: Hearing, tympanic membrane, external ear and ear canal normal.  Nose: Nose normal.  Mouth/Throat: Uvula is midline, oropharynx is clear and moist and mucous membranes are normal. No oropharyngeal exudate.  Eyes: Conjunctivae are normal. Pupils are equal, round, and reactive to light. Right eye exhibits no discharge. Left eye exhibits no discharge. No scleral icterus.  Neck: Normal range of motion. Neck supple. No tracheal deviation present. No thyromegaly present.  Cardiovascular: Normal rate, regular rhythm and normal heart sounds. Exam reveals no gallop and no friction rub.  No murmur heard. Pulmonary/Chest: Effort normal and breath sounds normal. No stridor. No respiratory distress. She has no wheezes. She  has no rales.  Lymphadenopathy:    She has no cervical adenopathy.  Skin: Skin is warm and dry. She is not diaphoretic.  Vitals reviewed.       Assessment & Plan:     1. Upper respiratory tract infection, unspecified type Worsening symptoms that have not responded to OTC medications. Will give Doxycycline as below. Continue allergy medications. Stay well hydrated and get plenty of rest.  Call if no symptom improvement or if symptoms worsen. - doxycycline (VIBRA-TABS) 100 MG tablet; Take 1 tablet (100 mg total) by mouth 2 (two) times daily.  Dispense: 20 tablet; Refill: 0  2. ETD (Eustachian tube dysfunction), right Worsening in right ear. Flonase as below to right side. Call if no improvements.  - fluticasone (FLONASE) 50 MCG/ACT nasal spray; Place 2 sprays into both nostrils daily.  Dispense: 16 g; Refill: 6  3. Other infective acute otitis externa of right ear See above medical treatment plan #1.  - doxycycline (VIBRA-TABS) 100 MG tablet; Take 1 tablet (100 mg total) by mouth 2 (two) times daily.  Dispense: 20 tablet; Refill: 0       Mar Daring, PA-C  [facility]

## 2018-01-06 NOTE — Patient Instructions (Signed)
Eustachian Tube Dysfunction The eustachian tube connects the middle ear to the back of the nose. It regulates air pressure in the middle ear by allowing air to move between the ear and nose. It also helps to drain fluid from the middle ear space. When the eustachian tube does not function properly, air pressure, fluid, or both can build up in the middle ear. Eustachian tube dysfunction can affect one or both ears. What are the causes? This condition happens when the eustachian tube becomes blocked or cannot open normally. This may result from:  Ear infections.  Colds and other upper respiratory infections.  Allergies.  Irritation, such as from cigarette smoke or acid from the stomach coming up into the esophagus (gastroesophageal reflux).  Sudden changes in air pressure, such as from descending in an airplane.  Abnormal growths in the nose or throat, such as nasal polyps, tumors, or enlarged tissue at the back of the throat (adenoids).  What increases the risk? This condition may be more likely to develop in people who smoke and people who are overweight. Eustachian tube dysfunction may also be more likely to develop in children, especially children who have:  Certain birth defects of the mouth, such as cleft palate.  Large tonsils and adenoids.  What are the signs or symptoms? Symptoms of this condition may include:  A feeling of fullness in the ear.  Ear pain.  Clicking or popping noises in the ear.  Ringing in the ear.  Hearing loss.  Loss of balance.  Symptoms may get worse when the air pressure around you changes, such as when you travel to an area of high elevation or fly on an airplane. How is this diagnosed? This condition may be diagnosed based on:  Your symptoms.  A physical exam of your ear, nose, and throat.  Tests, such as those that measure: ? The movement of your eardrum (tympanogram). ? Your hearing (audiometry).  How is this treated? Treatment  depends on the cause and severity of your condition. If your symptoms are mild, you may be able to relieve your symptoms by moving air into ("popping") your ears. If you have symptoms of fluid in your ears, treatment may include:  Decongestants.  Antihistamines.  Nasal sprays or ear drops that contain medicines that reduce swelling (steroids).  In some cases, you may need to have a procedure to drain the fluid in your eardrum (myringotomy). In this procedure, a small tube is placed in the eardrum to:  Drain the fluid.  Restore the air in the middle ear space.  Follow these instructions at home:  Take over-the-counter and prescription medicines only as told by your health care provider.  Use techniques to help pop your ears as recommended by your health care provider. These may include: ? Chewing gum. ? Yawning. ? Frequent, forceful swallowing. ? Closing your mouth, holding your nose closed, and gently blowing as if you are trying to blow air out of your nose.  Do not do any of the following until your health care provider approves: ? Travel to high altitudes. ? Fly in airplanes. ? Work in a pressurized cabin or room. ? Scuba dive.  Keep your ears dry. Dry your ears completely after showering or bathing.  Do not smoke.  Keep all follow-up visits as told by your health care provider. This is important. Contact a health care provider if:  Your symptoms do not go away after treatment.  Your symptoms come back after treatment.  You are   unable to pop your ears.  You have: ? A fever. ? Pain in your ear. ? Pain in your head or neck. ? Fluid draining from your ear.  Your hearing suddenly changes.  You become very dizzy.  You lose your balance. This information is not intended to replace advice given to you by your health care provider. Make sure you discuss any questions you have with your health care provider. Document Released: 01/11/2016 Document Revised: 05/22/2016  Document Reviewed: 01/03/2015 Elsevier Interactive Patient Education  2018 Elsevier Inc.  

## 2018-01-11 DIAGNOSIS — F411 Generalized anxiety disorder: Secondary | ICD-10-CM | POA: Diagnosis not present

## 2018-01-11 DIAGNOSIS — F330 Major depressive disorder, recurrent, mild: Secondary | ICD-10-CM | POA: Diagnosis not present

## 2018-01-11 DIAGNOSIS — F5105 Insomnia due to other mental disorder: Secondary | ICD-10-CM | POA: Diagnosis not present

## 2018-01-20 DIAGNOSIS — F411 Generalized anxiety disorder: Secondary | ICD-10-CM | POA: Diagnosis not present

## 2018-01-24 DIAGNOSIS — F411 Generalized anxiety disorder: Secondary | ICD-10-CM | POA: Diagnosis not present

## 2018-01-24 DIAGNOSIS — F5105 Insomnia due to other mental disorder: Secondary | ICD-10-CM | POA: Diagnosis not present

## 2018-01-24 DIAGNOSIS — F330 Major depressive disorder, recurrent, mild: Secondary | ICD-10-CM | POA: Diagnosis not present

## 2018-01-29 DIAGNOSIS — F411 Generalized anxiety disorder: Secondary | ICD-10-CM | POA: Diagnosis not present

## 2018-02-15 DIAGNOSIS — F411 Generalized anxiety disorder: Secondary | ICD-10-CM | POA: Diagnosis not present

## 2018-02-15 DIAGNOSIS — F330 Major depressive disorder, recurrent, mild: Secondary | ICD-10-CM | POA: Diagnosis not present

## 2018-02-15 DIAGNOSIS — F5105 Insomnia due to other mental disorder: Secondary | ICD-10-CM | POA: Diagnosis not present

## 2018-02-24 DIAGNOSIS — F411 Generalized anxiety disorder: Secondary | ICD-10-CM | POA: Diagnosis not present

## 2018-02-24 DIAGNOSIS — F422 Mixed obsessional thoughts and acts: Secondary | ICD-10-CM | POA: Diagnosis not present

## 2018-02-24 DIAGNOSIS — F3341 Major depressive disorder, recurrent, in partial remission: Secondary | ICD-10-CM | POA: Diagnosis not present

## 2018-02-26 ENCOUNTER — Encounter: Payer: Self-pay | Admitting: Physician Assistant

## 2018-02-26 ENCOUNTER — Ambulatory Visit (INDEPENDENT_AMBULATORY_CARE_PROVIDER_SITE_OTHER): Payer: Federal, State, Local not specified - PPO | Admitting: Physician Assistant

## 2018-02-26 VITALS — BP 90/70 | HR 76 | Temp 97.7°F | Resp 16 | Wt 121.4 lb

## 2018-02-26 DIAGNOSIS — A084 Viral intestinal infection, unspecified: Secondary | ICD-10-CM | POA: Diagnosis not present

## 2018-02-26 MED ORDER — ONDANSETRON HCL 4 MG PO TABS: 4.0000 mg | ORAL_TABLET | Freq: Three times a day (TID) | ORAL | 0 refills | Status: DC | PRN

## 2018-02-26 NOTE — Progress Notes (Signed)
Patient: Rhonda Cole Female    DOB: [birth year]   22 y.o.   MRN: [id number] Visit Date: 02/26/2018  Today's Provider: Mar Daring, PA-C   No chief complaint on file.  Subjective:    HPI Patient coming in today with c/o vomiting. She reports it started Sunday night. She has been vomiting periodically. Reports she had some abdominal discomfort and nausea but is better.Reports that she is feeling better. She reports last vomit was yesterday morning. Denies Fever or URI symptoms. She reports that she needs a note for school as she missed an exam in class on Thursday that she needs to make up.  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     Allergies  Allergen Reactions  . Augmentin [Amoxicillin-Pot Clavulanate] Rash     Current Outpatient Medications:  .  fluticasone (FLONASE) 50 MCG/ACT nasal spray, Place 2 sprays into both nostrils daily., Disp: 16 g, Rfl: 6 .  hydrOXYzine (ATARAX/VISTARIL) 25 MG tablet, take 1-2 tablets by mouth at bedtime if needed for anxiety, Disp: , Rfl: 0 .  norethindrone-ethinyl estradiol-iron (BLISOVI FE 1.5/30) 1.5-30 MG-MCG tablet, Take 1 tablet by mouth daily., Disp: , Rfl:  .  sertraline (ZOLOFT) 50 MG tablet, TK 1 T PO D, Disp: , Rfl: 2 .  AFLURIA QUADRIVALENT 0.5 ML injection, inject 0.5 milliliter intramuscularly, Disp: , Rfl: 0 .  BOOSTRIX 5-2.5-18.5 LF-MCG/0.5 injection, inject 0.5 milliliter intramuscularly, Disp: , Rfl: 0 .  magic mouthwash w/lidocaine SOLN, Take 5 mLs by mouth 3 (three) times daily as needed for mouth pain. (Patient not taking: Reported on 01/06/2018), Disp: 240 mL, Rfl: 0  Review of Systems  Constitutional: Negative for fever.  HENT: Negative.   Respiratory: Negative for chest tightness and shortness of breath.   Cardiovascular: Negative for chest pain, palpitations and leg swelling.  Gastrointestinal: Positive for nausea. Negative for abdominal pain and vomiting.  Neurological: Negative  for headaches.    Social History   Tobacco Use  . Smoking status: Never Smoker  . Smokeless tobacco: Never Used  Substance Use Topics  . Alcohol use: No   Objective:   BP 90/70 (BP Location: Left Arm, Patient Position: Sitting, Cuff Size: Normal)   Pulse 76   Temp 97.7 F (36.5 C) (Oral)   Resp 16   Wt 121 lb 6.4 oz (55.1 kg)   LMP 02/21/2018   SpO2 98%   BMI 19.59 kg/m    Physical Exam  Constitutional: She is oriented to person, place, and time. She appears well-developed and well-nourished. No distress.  Cardiovascular: Normal rate, regular rhythm and normal heart sounds. Exam reveals no gallop and no friction rub.  No murmur heard. Pulmonary/Chest: Effort normal and breath sounds normal. No respiratory distress. She has no wheezes. She has no rales.  Abdominal: Soft. Normal appearance and bowel sounds are normal. She exhibits no distension and no mass. There is no hepatosplenomegaly. There is no tenderness. There is no rebound, no guarding and no CVA tenderness.  Neurological: She is alert and oriented to person, place, and time.  Skin: Skin is warm and dry. She is not diaphoretic.        Assessment & Plan:     1. Viral gastroenteritis Improving. Still some residual nausea so will send Zofran as below. School note given to patient.  - ondansetron (ZOFRAN) 4 MG tablet; Take 1 tablet (4 mg total) by mouth every 8 (eight) hours as needed  for nausea or vomiting.  Dispense: 20 tablet; Refill: 0       Mar Daring, PA-C  [facility]

## 2018-02-26 NOTE — Patient Instructions (Signed)
Nausea, Adult  Nausea is the feeling of an upset stomach or having to vomit. Nausea on its own is not usually a serious concern, but it may be an early sign of a more serious medical problem. As nausea gets worse, it can lead to vomiting. If vomiting develops, or if you are not able to drink enough fluids, you are at risk of becoming dehydrated. Dehydration can make you tired and thirsty, cause you to have a dry mouth, and decrease how often you urinate. Older adults and people with other diseases or a weak immune system are at higher risk for dehydration. The main goals of treating your nausea are:   To limit repeated nausea episodes.   To prevent vomiting and dehydration.    Follow these instructions at home:  Follow instructions from your health care provider about how to care for yourself at home.  Eating and drinking  Follow these recommendations as told by your health care provider:   Take an oral rehydration solution (ORS). This is a drink that is sold at pharmacies and retail stores.   Drink clear fluids in small amounts as you are able. Clear fluids include water, ice chips, diluted fruit juice, and low-calorie sports drinks.   Eat bland, easy-to-digest foods in small amounts as you are able. These foods include bananas, applesauce, rice, lean meats, toast, and crackers.   Avoid drinking fluids that contain a lot of sugar or caffeine, such as energy drinks, sports drinks, and soda.   Avoid alcohol.   Avoid spicy or fatty foods.    General instructions   Drink enough fluid to keep your urine clear or pale yellow.   Wash your hands often. If soap and water are not available, use hand sanitizer.   Make sure that all people in your household wash their hands well and often.   Rest at home while you recover.   Take over-the-counter and prescription medicines only as told by your health care provider.   Breathe slowly and deeply when you feel nauseous.   Watch your condition for any  changes.   Keep all follow-up visits as told by your health care provider. This is important.  Contact a health care provider if:   You have a headache.   You have new symptoms.   Your nausea gets worse.   You have a fever.   You feel light-headed or dizzy.   You vomit.   You cannot keep fluids down.  Get help right away if:   You have pain in your chest, neck, arm, or jaw.   You feel extremely weak or you faint.   You have vomit that is bright red or looks like coffee grounds.   You have bloody or black stools or stools that look like tar.   You have a severe headache, a stiff neck, or both.   You have severe pain, cramping, or bloating in your abdomen.   You have a rash.   You have difficulty breathing or are breathing very quickly.   Your heart is beating very quickly.   Your skin feels cold and clammy.   You feel confused.   You have pain when you urinate.   You have signs of dehydration, such as:  ? Dark urine, very little, or no urine.  ? Cracked lips.  ? Dry mouth.  ? Sunken eyes.  ? Sleepiness.  ? Weakness.  These symptoms may represent a serious problem that is an emergency. Do not wait   to see if the symptoms will go away. Get medical help right away. Call your local emergency services (911 in the [place] Do not drive yourself to the hospital.  This information is not intended to replace advice given to you by your health care provider. Make sure you discuss any questions you have with your health care provider.  Document Released: 01/22/2005 Document Revised: 05/19/2016 Document Reviewed: 08/21/2015  Elsevier Interactive Patient Education  2018 Elsevier Inc.

## 2018-03-03 DIAGNOSIS — F411 Generalized anxiety disorder: Secondary | ICD-10-CM | POA: Diagnosis not present

## 2018-03-03 DIAGNOSIS — F3341 Major depressive disorder, recurrent, in partial remission: Secondary | ICD-10-CM | POA: Diagnosis not present

## 2018-03-03 DIAGNOSIS — F422 Mixed obsessional thoughts and acts: Secondary | ICD-10-CM | POA: Diagnosis not present

## 2018-03-12 DIAGNOSIS — F411 Generalized anxiety disorder: Secondary | ICD-10-CM | POA: Diagnosis not present

## 2018-03-12 DIAGNOSIS — F330 Major depressive disorder, recurrent, mild: Secondary | ICD-10-CM | POA: Diagnosis not present

## 2018-03-12 DIAGNOSIS — F5105 Insomnia due to other mental disorder: Secondary | ICD-10-CM | POA: Diagnosis not present

## 2018-03-24 DIAGNOSIS — F422 Mixed obsessional thoughts and acts: Secondary | ICD-10-CM | POA: Diagnosis not present

## 2018-03-24 DIAGNOSIS — F411 Generalized anxiety disorder: Secondary | ICD-10-CM | POA: Diagnosis not present

## 2018-03-24 DIAGNOSIS — F3341 Major depressive disorder, recurrent, in partial remission: Secondary | ICD-10-CM | POA: Diagnosis not present

## 2018-03-24 DIAGNOSIS — K080 Exfoliation of teeth due to systemic causes: Secondary | ICD-10-CM | POA: Diagnosis not present

## 2018-04-07 DIAGNOSIS — F3341 Major depressive disorder, recurrent, in partial remission: Secondary | ICD-10-CM | POA: Diagnosis not present

## 2018-04-07 DIAGNOSIS — F422 Mixed obsessional thoughts and acts: Secondary | ICD-10-CM | POA: Diagnosis not present

## 2018-04-07 DIAGNOSIS — F411 Generalized anxiety disorder: Secondary | ICD-10-CM | POA: Diagnosis not present

## 2018-04-14 DIAGNOSIS — F422 Mixed obsessional thoughts and acts: Secondary | ICD-10-CM | POA: Diagnosis not present

## 2018-04-14 DIAGNOSIS — F411 Generalized anxiety disorder: Secondary | ICD-10-CM | POA: Diagnosis not present

## 2018-04-14 DIAGNOSIS — F3341 Major depressive disorder, recurrent, in partial remission: Secondary | ICD-10-CM | POA: Diagnosis not present

## 2018-04-28 DIAGNOSIS — F422 Mixed obsessional thoughts and acts: Secondary | ICD-10-CM | POA: Diagnosis not present

## 2018-04-28 DIAGNOSIS — F411 Generalized anxiety disorder: Secondary | ICD-10-CM | POA: Diagnosis not present

## 2018-04-28 DIAGNOSIS — F3341 Major depressive disorder, recurrent, in partial remission: Secondary | ICD-10-CM | POA: Diagnosis not present

## 2018-05-12 DIAGNOSIS — F411 Generalized anxiety disorder: Secondary | ICD-10-CM | POA: Diagnosis not present

## 2018-05-12 DIAGNOSIS — F3341 Major depressive disorder, recurrent, in partial remission: Secondary | ICD-10-CM | POA: Diagnosis not present

## 2018-05-12 DIAGNOSIS — F422 Mixed obsessional thoughts and acts: Secondary | ICD-10-CM | POA: Diagnosis not present

## 2018-05-19 DIAGNOSIS — F411 Generalized anxiety disorder: Secondary | ICD-10-CM | POA: Diagnosis not present

## 2018-05-19 DIAGNOSIS — F5105 Insomnia due to other mental disorder: Secondary | ICD-10-CM | POA: Diagnosis not present

## 2018-05-19 DIAGNOSIS — F330 Major depressive disorder, recurrent, mild: Secondary | ICD-10-CM | POA: Diagnosis not present

## 2018-06-01 DIAGNOSIS — F411 Generalized anxiety disorder: Secondary | ICD-10-CM | POA: Diagnosis not present

## 2018-06-01 DIAGNOSIS — F422 Mixed obsessional thoughts and acts: Secondary | ICD-10-CM | POA: Diagnosis not present

## 2018-06-01 DIAGNOSIS — F3341 Major depressive disorder, recurrent, in partial remission: Secondary | ICD-10-CM | POA: Diagnosis not present

## 2018-06-15 DIAGNOSIS — F422 Mixed obsessional thoughts and acts: Secondary | ICD-10-CM | POA: Diagnosis not present

## 2018-06-15 DIAGNOSIS — F411 Generalized anxiety disorder: Secondary | ICD-10-CM | POA: Diagnosis not present

## 2018-06-15 DIAGNOSIS — F3341 Major depressive disorder, recurrent, in partial remission: Secondary | ICD-10-CM | POA: Diagnosis not present

## 2018-07-14 ENCOUNTER — Other Ambulatory Visit: Payer: Self-pay | Admitting: Obstetrics and Gynecology

## 2018-07-26 DIAGNOSIS — F422 Mixed obsessional thoughts and acts: Secondary | ICD-10-CM | POA: Diagnosis not present

## 2018-07-26 DIAGNOSIS — F3341 Major depressive disorder, recurrent, in partial remission: Secondary | ICD-10-CM | POA: Diagnosis not present

## 2018-07-26 DIAGNOSIS — F411 Generalized anxiety disorder: Secondary | ICD-10-CM | POA: Diagnosis not present

## 2018-07-27 ENCOUNTER — Other Ambulatory Visit: Payer: Self-pay | Admitting: Physician Assistant

## 2018-07-27 DIAGNOSIS — F411 Generalized anxiety disorder: Secondary | ICD-10-CM | POA: Diagnosis not present

## 2018-07-27 DIAGNOSIS — F5105 Insomnia due to other mental disorder: Secondary | ICD-10-CM | POA: Diagnosis not present

## 2018-07-27 DIAGNOSIS — F330 Major depressive disorder, recurrent, mild: Secondary | ICD-10-CM | POA: Diagnosis not present

## 2018-07-27 NOTE — Progress Notes (Signed)
Updated medication 

## 2018-08-04 DIAGNOSIS — F422 Mixed obsessional thoughts and acts: Secondary | ICD-10-CM | POA: Diagnosis not present

## 2018-08-04 DIAGNOSIS — F411 Generalized anxiety disorder: Secondary | ICD-10-CM | POA: Diagnosis not present

## 2018-08-04 DIAGNOSIS — F3341 Major depressive disorder, recurrent, in partial remission: Secondary | ICD-10-CM | POA: Diagnosis not present

## 2018-08-18 ENCOUNTER — Ambulatory Visit (INDEPENDENT_AMBULATORY_CARE_PROVIDER_SITE_OTHER): Payer: Federal, State, Local not specified - PPO | Admitting: Obstetrics and Gynecology

## 2018-08-18 ENCOUNTER — Encounter: Payer: Self-pay | Admitting: Obstetrics and Gynecology

## 2018-08-18 VITALS — BP 100/80 | HR 101 | Ht 65.0 in | Wt 124.0 lb

## 2018-08-18 DIAGNOSIS — R220 Localized swelling, mass and lump, head: Secondary | ICD-10-CM

## 2018-08-18 DIAGNOSIS — Z01411 Encounter for gynecological examination (general) (routine) with abnormal findings: Secondary | ICD-10-CM

## 2018-08-18 DIAGNOSIS — Z3041 Encounter for surveillance of contraceptive pills: Secondary | ICD-10-CM | POA: Diagnosis not present

## 2018-08-18 DIAGNOSIS — N946 Dysmenorrhea, unspecified: Secondary | ICD-10-CM

## 2018-08-18 DIAGNOSIS — Z01419 Encounter for gynecological examination (general) (routine) without abnormal findings: Secondary | ICD-10-CM

## 2018-08-18 MED ORDER — NORETHIN ACE-ETH ESTRAD-FE 1.5-30 MG-MCG PO TABS: 1.0000 | ORAL_TABLET | Freq: Every day | ORAL | 3 refills | Status: DC

## 2018-08-18 NOTE — Progress Notes (Signed)
Chief Complaint  Patient presents with  . Gynecologic Exam     HPI:      [name] is a 22 y.o. G0P0000 who LMP was Patient's last menstrual period was 08/14/2018 (exact date)., presents today for her annual examination.  Her menses are regular every 28-30 days, lasting 3-5 days.  Dysmenorrhea mild, occurring first 1-2 days of flow. She started OCPs for dysmen and cycle control a couple yrs ago. Cramps improved with OCPs, but not resolved. She does not have intermenstrual bleeding.  Sex activity: never  There is a FH of breast cancer in her mat grt aunt, genetic testing not indicated. There is no FH of ovarian cancer. The patient does do self-breast exams.  Tobacco use: The patient denies current or previous tobacco use. Alcohol use: none Exercise: moderately active  She does get adequate calcium and Vitamin D in her diet.  She has completed [place] vaccine series.   Pt complains of mass on her scalp behind RT ear. Has always been a bump there and was told it was a normal "skull bump", but mass has increased in size over the past 3-4 months and is now painful. Glasses press on it and cause increased pain as day goes on. Pt notes occas blurriness in peripheral vision. Last saw ophthalmology about a yr ago.   Past Medical History:  Diagnosis Date  . Anxiety   . Mild depression ([place])     Past Surgical History:  Procedure Laterality Date  . HERNIA REPAIR  1999   double when she was a baby  . TYMPANOSTOMY TUBE PLACEMENT  2000   x2  . WISDOM TOOTH EXTRACTION  2018    Family History  Problem Relation Age of Onset  . Migraines Mother   . Hyperlipidemia Father   . Hypertension Father   . Arthritis Father   . Asthma Sister   . Alzheimer's disease Maternal Grandmother   . Arthritis Maternal Grandmother   . Hyperlipidemia Paternal Grandfather   . Hypertension Paternal Grandfather   . Cancer Paternal Grandfather        COLON; LUNG; LYMPHOMA; ?  . Heart failure Paternal  Grandfather   . Heart attack Paternal Grandfather   . Cancer Other        BREAST    Social History   Socioeconomic History  . Marital status: Single    Spouse name: Not on file  . Number of children: Not on file  . Years of education: Not on file  . Highest education level: Not on file  Occupational History  . Not on file  Social Needs  . Financial resource strain: Not on file  . Food insecurity:    Worry: Not on file    Inability: Not on file  . Transportation needs:    Medical: Not on file    Non-medical: Not on file  Tobacco Use  . Smoking status: Never Smoker  . Smokeless tobacco: Never Used  Substance and Sexual Activity  . Alcohol use: No  . Drug use: No  . Sexual activity: Never    Birth control/protection: Pill  Lifestyle  . Physical activity:    Days per week: Not on file    Minutes per session: Not on file  . Stress: Not on file  Relationships  . Social connections:    Talks on phone: Not on file    Gets together: Not on file    Attends religious service: Not on file    Active  member of club or organization: Not on file    Attends meetings of clubs or organizations: Not on file    Relationship status: Not on file  . Intimate partner violence:    Fear of current or ex partner: Not on file    Emotionally abused: Not on file    Physically abused: Not on file    Forced sexual activity: Not on file  Other Topics Concern  . Not on file  Social History Narrative  . Not on file     Current Outpatient Medications:  .  hydrOXYzine (ATARAX/VISTARIL) 25 MG tablet, take 1-2 tablets by mouth at bedtime if needed for anxiety, Disp: , Rfl: 0 .  norethindrone-ethinyl estradiol-iron (BLISOVI FE 1.5/30) 1.5-30 MG-MCG tablet, Take 1 tablet by mouth daily., Disp: 3 Package, Rfl: 3 .  sertraline (ZOLOFT) 50 MG tablet, Take 2 tablets (100 mg total) by mouth at bedtime., Disp: , Rfl: 2  ROS:  Review of Systems  Constitutional: Negative for fatigue, fever and  unexpected weight change.  Respiratory: Negative for cough, shortness of breath and wheezing.   Cardiovascular: Negative for chest pain, palpitations and leg swelling.  Gastrointestinal: Negative for blood in stool, constipation, diarrhea, nausea and vomiting.  Endocrine: Negative for cold intolerance, heat intolerance and polyuria.  Genitourinary: Positive for vaginal discharge. Negative for dyspareunia, dysuria, flank pain, frequency, genital sores, hematuria, menstrual problem, pelvic pain, urgency, vaginal bleeding and vaginal pain.  Musculoskeletal: Negative for back pain, joint swelling and myalgias.  Skin: Negative for rash.  Neurological: Negative for dizziness, syncope, light-headedness, numbness and headaches.  Hematological: Negative for adenopathy.  Psychiatric/Behavioral: Positive for agitation and dysphoric mood. Negative for confusion, sleep disturbance and suicidal ideas. The patient is not nervous/anxious.      Objective: BP 100/80   Pulse (!) 101   Ht 5\' 5"  (1.651 m)   Wt 124 lb (56.2 kg)   LMP 08/14/2018 (Exact Date)   BMI 20.63 kg/m    Physical Exam  Constitutional: She is oriented to person, place, and time. She appears well-developed and well-nourished.  HENT:  Head:    ~1 CM FIRM, TENDER, NON-MOBILE MASS ON SCALP BEHIND RT EAR  Neck: Normal range of motion. No thyromegaly present.  Cardiovascular: Normal rate, regular rhythm and normal heart sounds.  No murmur heard. Pulmonary/Chest: Effort normal and breath sounds normal. Right breast exhibits no mass, no nipple discharge, no skin change and no tenderness. Left breast exhibits no mass, no nipple discharge, no skin change and no tenderness.  Abdominal: Soft. There is no tenderness. There is no guarding.  Musculoskeletal: Normal range of motion.  Neurological: She is alert and oriented to person, place, and time. No cranial nerve deficit.  Psychiatric: She has a normal mood and affect. Her behavior is  normal.  Vitals reviewed. GYN EXAM DEFERRED SINCE NEVER SEX ACTIVE   Assessment/Plan: Encounter for annual routine gynecological examination  Encounter for surveillance of contraceptive pills - OCP RF - Plan: norethindrone-ethinyl estradiol-iron (BLISOVI FE 1.5/30) 1.5-30 MG-MCG tablet  Dysmenorrhea - Sx improved with OCPs. Cont meds. - Plan: norethindrone-ethinyl estradiol-iron (BLISOVI FE 1.5/30) 1.5-30 MG-MCG tablet  Scalp mass - Question cyst vs bone. Refer to gen surg for further eval. - Plan: Ambulatory referral to General Surgery             GYN counsel adequate intake of calcium and vitamin D     F/U  Return in about 1 year (around 08/19/2019).  Alicia B. Copland, PA-C 08/18/2018 4:22 PM

## 2018-08-18 NOTE — Patient Instructions (Signed)
I value your feedback and entrusting us with your care. If you get a [facility] patient survey, I would appreciate you taking the time to let us know about your experience today. Thank you! 

## 2018-08-20 DIAGNOSIS — F411 Generalized anxiety disorder: Secondary | ICD-10-CM | POA: Diagnosis not present

## 2018-08-20 DIAGNOSIS — F5105 Insomnia due to other mental disorder: Secondary | ICD-10-CM | POA: Diagnosis not present

## 2018-08-20 DIAGNOSIS — F330 Major depressive disorder, recurrent, mild: Secondary | ICD-10-CM | POA: Diagnosis not present

## 2018-08-25 DIAGNOSIS — H1045 Other chronic allergic conjunctivitis: Secondary | ICD-10-CM | POA: Diagnosis not present

## 2018-09-10 DIAGNOSIS — F411 Generalized anxiety disorder: Secondary | ICD-10-CM | POA: Diagnosis not present

## 2018-09-10 DIAGNOSIS — F422 Mixed obsessional thoughts and acts: Secondary | ICD-10-CM | POA: Diagnosis not present

## 2018-09-10 DIAGNOSIS — F3341 Major depressive disorder, recurrent, in partial remission: Secondary | ICD-10-CM | POA: Diagnosis not present

## 2018-09-13 ENCOUNTER — Encounter: Payer: Self-pay | Admitting: Surgery

## 2018-09-13 ENCOUNTER — Ambulatory Visit: Payer: Federal, State, Local not specified - PPO | Admitting: Surgery

## 2018-09-13 VITALS — BP 131/95 | HR 125 | Temp 98.1°F | Resp 99 | Ht 66.0 in | Wt 122.2 lb

## 2018-09-13 DIAGNOSIS — M898X8 Other specified disorders of bone, other site: Secondary | ICD-10-CM

## 2018-09-13 DIAGNOSIS — M899 Disorder of bone, unspecified: Secondary | ICD-10-CM | POA: Diagnosis not present

## 2018-09-13 NOTE — Patient Instructions (Signed)
We will place the order for the CT Scan and then we will give you a call.  We will refer you to the Neurosurgeon for a consult. They will be contacting you.

## 2018-09-15 ENCOUNTER — Encounter: Payer: Self-pay | Admitting: Surgery

## 2018-09-15 NOTE — Progress Notes (Signed)
Patient ID: Rhonda Cole, female   DOB: 1996-06-08, 22 y.o.   MRN: [id number]  HPI Rhonda Cole is a 22 y.o. female consultation at the request of Mrs Copland PA-C.  Ports having a skull mass behind her ear.  He has had it for several years but over the last 3 to 4 months has been having some intermittent symptoms.  She experienced some discomfort and mild pain that is sharp in nature and is worsening when she was glasses when she comes her hair.  There is really no increase in the size.  She did have some episodes of dizziness and vomiting as well. There are no images available.  She is otherwise healthy and she is able to perform more than 4 METS of activity without any shortness of breath or chest pain.  She has had does have a history of a maternal great aunt with breast cancer. Weight loss and no other symptoms.  HPI  Past Medical History:  Diagnosis Date  . Anxiety   . Mild depression ([place])     Past Surgical History:  Procedure Laterality Date  . HERNIA REPAIR  1999   double when she was a baby  . INGUINAL HERNIA REPAIR Bilateral 2001  . TYMPANOSTOMY TUBE PLACEMENT  2000   x2  . WISDOM TOOTH EXTRACTION  2018    Family History  Problem Relation Age of Onset  . Migraines Mother   . Hyperlipidemia Father   . Hypertension Father   . Arthritis Father   . Asthma Sister   . Alzheimer's disease Maternal Grandmother   . Arthritis Maternal Grandmother   . Hyperlipidemia Paternal Grandfather   . Hypertension Paternal Grandfather   . Cancer Paternal Grandfather        COLON; LUNG; LYMPHOMA; ?  . Heart failure Paternal Grandfather   . Heart attack Paternal Grandfather   . Cancer Other        BREAST    Social History Social History   Tobacco Use  . Smoking status: Never Smoker  . Smokeless tobacco: Never Used  Substance Use Topics  . Alcohol use: No  . Drug use: No    Allergies  Allergen Reactions  . Augmentin [Amoxicillin-Pot Clavulanate] Rash    Current  Outpatient Medications  Medication Sig Dispense Refill  . hydrOXYzine (ATARAX/VISTARIL) 25 MG tablet take 1-2 tablets by mouth at bedtime if needed for anxiety  0  . loteprednol (LOTEMAX) 0.5 % ophthalmic suspension INSTILL 1 DROP INTO BOTH EYES QID UTD  0  . norethindrone-ethinyl estradiol-iron (BLISOVI FE 1.5/30) 1.5-30 MG-MCG tablet Take 1 tablet by mouth daily. 3 Package 3  . sertraline (ZOLOFT) 100 MG tablet TK 1 T PO D  1   No current facility-administered medications for this visit.      Review of Systems Full ROS  was asked and was negative except for the information on the HPI  Physical Exam Blood pressure (!) 131/95, pulse (!) 125, temperature 98.1 F (36.7 C), temperature source Temporal, resp. rate (!) 99, height 5\' 6"  (1.676 m), weight 122 lb 3.2 oz (55.4 kg), last menstrual period 09/06/2018. CONSTITUTIONAL: NADF EYES: Pupils are equal, round, and reactive to light, Sclera are non-icteric. EARS, NOSE, MOUTH AND THROAT: The oropharynx is clear. The oral mucosa is pink and moist. Hearing is intact to voice HEAD: There is a 3 cm skull lesion on the temporal bone above the mastoid process on the right side.  Minimal tenderness to palpation.  This is definitely primary lesion  from the bone. LYMPH NODES:  Lymph nodes in the neck are normal. RESPIRATORY:  Lungs are clear. There is normal respiratory effort, with equal breath sounds bilaterally, and without pathologic use of accessory muscles. CARDIOVASCULAR: Heart is regular without murmurs, gallops, or rubs. GI: The abdomen is soft, nontender, and nondistended. There are no palpable masses. There is no hepatosplenomegaly. There are normal bowel sounds in all quadrants. GU: Rectal deferred.   MUSCULOSKELETAL: Normal muscle strength and tone. No cyanosis or edema.   SKIN: Turgor is good and there are no pathologic skin lesions or ulcers. NEUROLOGIC: Motor and sensation is grossly normal. Cranial nerves are grossly intact. PSYCH:   Oriented to person, place and time. Affect is normal.  Data Reviewed  I have personally reviewed the patient's imaging, laboratory findings and medical records.    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  Will obtain a CT scan of the head to body this lesion further.  We will also like to request and sent a neurosurgery consult because obviously I do not do any schoolwork.  I am uncertain about the etiology of this mass but will have to rule out any potentially primary neoplasms.  Discussed with the patient and mother in detail.  A copy of this report will be sent to the referring provider.   Caroleen Hamman, MD FACS General Surgeon 09/15/2018, 9:03 AM

## 2018-09-20 ENCOUNTER — Ambulatory Visit
Admission: RE | Admit: 2018-09-20 | Discharge: 2018-09-20 | Disposition: A | Discharge status: Home / Self Care | Payer: Federal, State, Local not specified - PPO | Source: Ambulatory Visit | Attending: Surgery | Admitting: Surgery

## 2018-09-20 ENCOUNTER — Telehealth: Payer: Self-pay | Admitting: *Deleted

## 2018-09-20 ENCOUNTER — Telehealth: Payer: Self-pay

## 2018-09-20 DIAGNOSIS — R42 Dizziness and giddiness: Secondary | ICD-10-CM | POA: Diagnosis not present

## 2018-09-20 DIAGNOSIS — D164 Benign neoplasm of bones of skull and face: Secondary | ICD-10-CM | POA: Diagnosis not present

## 2018-09-20 DIAGNOSIS — M899 Disorder of bone, unspecified: Secondary | ICD-10-CM | POA: Diagnosis not present

## 2018-09-20 DIAGNOSIS — M898X8 Other specified disorders of bone, other site: Secondary | ICD-10-CM

## 2018-09-20 MED ORDER — IOPAMIDOL (ISOVUE-300) INJECTION 61%
75.0000 mL | Freq: Once | INTRAVENOUS | Status: AC | PRN
  Administered 2018-09-20: 75 mL via INTRAVENOUS

## 2018-09-20 NOTE — Telephone Encounter (Signed)
Patients mother called regarding patients CT scan, call patients mother at [phone] for results

## 2018-09-20 NOTE — Telephone Encounter (Signed)
Called patient but was not able to leave her a voicemail. Therefore, I contacted her mother and asked if she could tell her daughter that her CT results were in and that Dr. Dahlia Byes had reviewed them and that it showed skull osteoma and that he wanted her to be seen by the Neurosurgeon just in case he and her daughter chose to have surgery if needed. Patient's mother agreed. I told her that the referral was sent and that [facility] would give them a call to set up appointment date and time. Patient's mother did not have further questions.

## 2018-09-27 DIAGNOSIS — D164 Benign neoplasm of bones of skull and face: Secondary | ICD-10-CM | POA: Insufficient documentation

## 2018-09-27 DIAGNOSIS — R220 Localized swelling, mass and lump, head: Secondary | ICD-10-CM | POA: Insufficient documentation

## 2018-09-27 DIAGNOSIS — R030 Elevated blood-pressure reading, without diagnosis of hypertension: Secondary | ICD-10-CM | POA: Diagnosis not present

## 2018-09-27 DIAGNOSIS — M898X8 Other specified disorders of bone, other site: Secondary | ICD-10-CM | POA: Diagnosis not present

## 2018-10-15 DIAGNOSIS — F330 Major depressive disorder, recurrent, mild: Secondary | ICD-10-CM | POA: Diagnosis not present

## 2018-10-15 DIAGNOSIS — F411 Generalized anxiety disorder: Secondary | ICD-10-CM | POA: Diagnosis not present

## 2018-10-15 DIAGNOSIS — F5105 Insomnia due to other mental disorder: Secondary | ICD-10-CM | POA: Diagnosis not present

## 2018-10-25 ENCOUNTER — Encounter: Payer: Self-pay | Admitting: Family Medicine

## 2018-10-25 ENCOUNTER — Ambulatory Visit: Payer: Federal, State, Local not specified - PPO | Admitting: Family Medicine

## 2018-10-25 VITALS — BP 102/74 | HR 113 | Temp 98.5°F | Resp 16 | Wt 119.8 lb

## 2018-10-25 DIAGNOSIS — J0300 Acute streptococcal tonsillitis, unspecified: Secondary | ICD-10-CM

## 2018-10-25 DIAGNOSIS — Z20818 Contact with and (suspected) exposure to other bacterial communicable diseases: Secondary | ICD-10-CM | POA: Diagnosis not present

## 2018-10-25 LAB — POCT RAPID STREP A (OFFICE): Rapid Strep A Screen: POSITIVE — AB

## 2018-10-25 MED ORDER — ONDANSETRON HCL 4 MG PO TABS: 4.0000 mg | ORAL_TABLET | Freq: Three times a day (TID) | ORAL | 0 refills | Status: DC | PRN

## 2018-10-25 MED ORDER — AZITHROMYCIN 250 MG PO TABS: ORAL_TABLET | ORAL | 0 refills | Status: DC

## 2018-10-25 NOTE — Patient Instructions (Signed)
Let us know if you are not improving.

## 2018-10-25 NOTE — Progress Notes (Signed)
  Subjective:     Patient ID: [name], female   DOB: April 12, 1996, 22 y.o.   MRN: [id number] Chief Complaint  Patient presents with  . Generalized Body Aches    Patient comes in office today with concerns of vomiting, body aches and sore throat that began 2 days ago. Patient reports fever high of 99.8, neck pain, back ache, ulcer on tongue and pain in both ears. 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    HPI Currently works in a pre-school and takes Firefighter. Accompanied by her mother today.  Review of Systems  Gastrointestinal: Positive for nausea.       Objective:   Physical Exam  Constitutional: She appears well-developed and well-nourished. No distress.  Ears: T.M's intact without inflammation Throat: mild tonsillar enlargement with mild erythema Neck: Enlarged bilateral cervical nodes. Lungs: clear     Assessment:    1. Exposure to strep throat - POCT rapid strep A  2. Strep tonsillitis: refilled ondansetron - azithromycin (ZITHROMAX Z-PAK) 250 MG tablet; Take two pills today then one pill daily  Dispense: 6 each; Refill: 0    Plan:    School and work excuses provided for 10/28-11/1/19.

## 2018-10-25 NOTE — Progress Notes (Deleted)
.  so

## 2018-11-08 ENCOUNTER — Telehealth: Payer: Self-pay

## 2018-11-08 ENCOUNTER — Encounter: Payer: Self-pay | Admitting: Physician Assistant

## 2018-11-08 ENCOUNTER — Ambulatory Visit: Payer: Federal, State, Local not specified - PPO | Admitting: Physician Assistant

## 2018-11-08 VITALS — BP 121/75 | HR 91 | Temp 99.5°F | Wt 119.6 lb

## 2018-11-08 DIAGNOSIS — R079 Chest pain, unspecified: Secondary | ICD-10-CM | POA: Diagnosis not present

## 2018-11-08 NOTE — Patient Instructions (Signed)
pepcid 20 mg 30 min before meal in morning or at night   Food Choices for Gastroesophageal Reflux Disease, Adult When you have gastroesophageal reflux disease (GERD), the foods you eat and your eating habits are very important. Choosing the right foods can help ease your discomfort. What guidelines do I need to follow?  Choose fruits, vegetables, whole grains, and low-fat dairy products.  Choose low-fat meat, fish, and poultry.  Limit fats such as oils, salad dressings, butter, nuts, and avocado.  Keep a food diary. This helps you identify foods that cause symptoms.  Avoid foods that cause symptoms. These may be different for everyone.  Eat small meals often instead of 3 large meals a day.  Eat your meals slowly, in a place where you are relaxed.  Limit fried foods.  Cook foods using methods other than frying.  Avoid drinking alcohol.  Avoid drinking large amounts of liquids with your meals.  Avoid bending over or lying down until 2-3 hours after eating. What foods are not recommended? These are some foods and drinks that may make your symptoms worse: Vegetables Tomatoes. Tomato juice. Tomato and spaghetti sauce. Chili peppers. Onion and garlic. Horseradish. Fruits Oranges, grapefruit, and lemon (fruit and juice). Meats High-fat meats, fish, and poultry. This includes hot dogs, ribs, ham, sausage, salami, and bacon. Dairy Whole milk and chocolate milk. Sour cream. Cream. Butter. Ice cream. Cream cheese. Drinks Coffee and tea. Bubbly (carbonated) drinks or energy drinks. Condiments Hot sauce. Barbecue sauce. Sweets/Desserts Chocolate and cocoa. Donuts. Peppermint and spearmint. Fats and Oils High-fat foods. This includes [place] fries and potato chips. Other Vinegar. Strong spices. This includes black pepper, white pepper, red pepper, cayenne, curry powder, cloves, ginger, and chili powder. The items listed above may not be a complete list of foods and drinks to avoid.  Contact your dietitian for more information. This information is not intended to replace advice given to you by your health care provider. Make sure you discuss any questions you have with your health care provider. Document Released: 06/15/2012 Document Revised: 05/22/2016 Document Reviewed: 10/19/2013 Elsevier Interactive Patient Education  2017 [employer].

## 2018-11-08 NOTE — Progress Notes (Signed)
Patient: Rhonda Cole Female    DOB: [birth year]   22 y.o.   MRN: [id number] Visit Date: 11/08/2018  Today's Provider: Trinna Post, PA-C   Chief Complaint  Patient presents with  . Chest Pain   Subjective:    HPI Chest Pain Patient presents today for chest pain and burning for 3 days now. Describes pain as retrosternal, burning and stabbing. Says it was associated with right arm and right leg tingling. Patient states she have taking Tylenol on 11/06/2018 and 11/08/2018. Worse with eating fairly soon after eating. Denies SOB. Denies increased anxiety. Occasional NSAIDS for headaches but otherwise not regularly. Currently on combo OCP.Marland Kitchen Strep three weeks ago - treated with z pack. Does have gallbladder Tried tylenol for pain - tried 3 TUMS and no help     Allergies  Allergen Reactions  . Augmentin [Amoxicillin-Pot Clavulanate] Rash     Current Outpatient Medications:  .  hydrOXYzine (ATARAX/VISTARIL) 25 MG tablet, take 1-2 tablets by mouth at bedtime if needed for anxiety, Disp: , Rfl: 0 .  loteprednol (LOTEMAX) 0.5 % ophthalmic suspension, INSTILL 1 DROP INTO BOTH EYES QID UTD, Disp: , Rfl: 0 .  norethindrone-ethinyl estradiol-iron (BLISOVI FE 1.5/30) 1.5-30 MG-MCG tablet, Take 1 tablet by mouth daily., Disp: 3 Package, Rfl: 3 .  ondansetron (ZOFRAN) 4 MG tablet, Take 1 tablet (4 mg total) by mouth every 8 (eight) hours as needed for nausea or vomiting., Disp: 12 tablet, Rfl: 0 .  sertraline (ZOLOFT) 100 MG tablet, TK 1 T PO D, Disp: , Rfl: 1  Review of Systems  HENT: Negative.   Cardiovascular: Positive for chest pain.  Gastrointestinal: Negative.   Genitourinary: Negative.   Psychiatric/Behavioral: Negative.     Social History   Tobacco Use  . Smoking status: Never Smoker  . Smokeless tobacco: Never Used  Substance Use Topics  . Alcohol use: No   Objective:   BP 121/75 (BP Location: Right Arm, Patient Position: Sitting, Cuff Size: Normal)   Pulse 91    Temp 99.5 F (37.5 C) (Oral)   Wt 119 lb 9.6 oz (54.3 kg)   LMP 11/01/2018   SpO2 97%   BMI 19.30 kg/m  Vitals:   11/08/18 1357  BP: 121/75  Pulse: 91  Temp: 99.5 F (37.5 C)  TempSrc: Oral  SpO2: 97%  Weight: 119 lb 9.6 oz (54.3 kg)     Physical Exam  Constitutional: She is oriented to person, place, and time. She appears well-developed and well-nourished. No distress.  HENT:  Mouth/Throat: Oropharynx is clear and moist.  Neck: Neck supple.  Cardiovascular: Normal rate and regular rhythm. Exam reveals no friction rub.  Pulmonary/Chest: Effort normal and breath sounds normal. No tachypnea.  Abdominal: Soft. Bowel sounds are normal. There is no tenderness.  Musculoskeletal:       Right lower leg: She exhibits no tenderness.  Neurological: She is alert and oriented to person, place, and time.  Skin: Skin is warm and dry.  Psychiatric: She has a normal mood and affect. Her behavior is normal.        Assessment & Plan:     1. Chest pain, unspecified type  EKG appears normal without ischemic changes, low suspicion for cardiac or pulmonary etiology. No reproducible tenderness on exam. Suspect acid reflux. Had very long conversation with patient about nature of this and low likelihood for cardiac etiology. Counseled on using pepcid and avoiding trigger foods.   - EKG 12-Lead  Return if  symptoms worsen or fail to improve.  The entirety of the information documented in the History of Present Illness, Review of Systems and Physical Exam were personally obtained by me. Portions of this information were initially documented by Hurman Horn, CMA and reviewed by me for thoroughness and accuracy.   I have spent 25 minutes with this patient, >50% of which was spent on counseling and coordination of care.         Trinna Post, PA-C  [facility]

## 2018-11-08 NOTE — Telephone Encounter (Signed)
Patient called c/o burning sensation in her chest and difficulty swallowing for the last 3 days. She reports that she would experience the pain mainly after eating, then it would subside. She reports that today the pain has lasted all day. She also feels the burning sensation in her upper back and it sometimes radiate to her right shoulder. She denies any difficultly breathing, dizziness, headache, or numbness and tingling in her extremities. She has only taken Tylenol on the first day of her symptoms, nothing since.   She was also seen in the office on 10/25/18 for strep throat, and reports that her symptoms completely resolved after being treated. Appt was scheduled today for evaluation. Advised patient if chest pain worsens, to go to the ER for evaluation. Patient verbalized understanding.

## 2018-11-17 DIAGNOSIS — F411 Generalized anxiety disorder: Secondary | ICD-10-CM | POA: Diagnosis not present

## 2018-11-17 DIAGNOSIS — F3341 Major depressive disorder, recurrent, in partial remission: Secondary | ICD-10-CM | POA: Diagnosis not present

## 2018-11-17 DIAGNOSIS — F422 Mixed obsessional thoughts and acts: Secondary | ICD-10-CM | POA: Diagnosis not present

## 2018-12-01 DIAGNOSIS — F411 Generalized anxiety disorder: Secondary | ICD-10-CM | POA: Diagnosis not present

## 2018-12-01 DIAGNOSIS — F3341 Major depressive disorder, recurrent, in partial remission: Secondary | ICD-10-CM | POA: Diagnosis not present

## 2018-12-01 DIAGNOSIS — F422 Mixed obsessional thoughts and acts: Secondary | ICD-10-CM | POA: Diagnosis not present

## 2018-12-24 DIAGNOSIS — F3341 Major depressive disorder, recurrent, in partial remission: Secondary | ICD-10-CM | POA: Diagnosis not present

## 2018-12-24 DIAGNOSIS — F411 Generalized anxiety disorder: Secondary | ICD-10-CM | POA: Diagnosis not present

## 2018-12-24 DIAGNOSIS — F422 Mixed obsessional thoughts and acts: Secondary | ICD-10-CM | POA: Diagnosis not present

## 2019-01-11 DIAGNOSIS — F330 Major depressive disorder, recurrent, mild: Secondary | ICD-10-CM | POA: Diagnosis not present

## 2019-01-11 DIAGNOSIS — F5105 Insomnia due to other mental disorder: Secondary | ICD-10-CM | POA: Diagnosis not present

## 2019-01-11 DIAGNOSIS — F411 Generalized anxiety disorder: Secondary | ICD-10-CM | POA: Diagnosis not present

## 2019-01-14 DIAGNOSIS — F3341 Major depressive disorder, recurrent, in partial remission: Secondary | ICD-10-CM | POA: Diagnosis not present

## 2019-01-14 DIAGNOSIS — F422 Mixed obsessional thoughts and acts: Secondary | ICD-10-CM | POA: Diagnosis not present

## 2019-01-14 DIAGNOSIS — F411 Generalized anxiety disorder: Secondary | ICD-10-CM | POA: Diagnosis not present

## 2019-01-24 DIAGNOSIS — F422 Mixed obsessional thoughts and acts: Secondary | ICD-10-CM | POA: Diagnosis not present

## 2019-01-24 DIAGNOSIS — F3341 Major depressive disorder, recurrent, in partial remission: Secondary | ICD-10-CM | POA: Diagnosis not present

## 2019-01-24 DIAGNOSIS — F411 Generalized anxiety disorder: Secondary | ICD-10-CM | POA: Diagnosis not present

## 2019-01-31 DIAGNOSIS — F330 Major depressive disorder, recurrent, mild: Secondary | ICD-10-CM | POA: Diagnosis not present

## 2019-01-31 DIAGNOSIS — F5105 Insomnia due to other mental disorder: Secondary | ICD-10-CM | POA: Diagnosis not present

## 2019-01-31 DIAGNOSIS — F411 Generalized anxiety disorder: Secondary | ICD-10-CM | POA: Diagnosis not present

## 2019-02-09 DIAGNOSIS — F411 Generalized anxiety disorder: Secondary | ICD-10-CM | POA: Diagnosis not present

## 2019-02-09 DIAGNOSIS — F422 Mixed obsessional thoughts and acts: Secondary | ICD-10-CM | POA: Diagnosis not present

## 2019-02-09 DIAGNOSIS — F3341 Major depressive disorder, recurrent, in partial remission: Secondary | ICD-10-CM | POA: Diagnosis not present

## 2019-02-17 DIAGNOSIS — F3341 Major depressive disorder, recurrent, in partial remission: Secondary | ICD-10-CM | POA: Diagnosis not present

## 2019-02-17 DIAGNOSIS — F411 Generalized anxiety disorder: Secondary | ICD-10-CM | POA: Diagnosis not present

## 2019-02-17 DIAGNOSIS — F422 Mixed obsessional thoughts and acts: Secondary | ICD-10-CM | POA: Diagnosis not present

## 2019-02-24 ENCOUNTER — Other Ambulatory Visit: Payer: Self-pay | Admitting: Neurosurgery

## 2019-02-24 DIAGNOSIS — D164 Benign neoplasm of bones of skull and face: Secondary | ICD-10-CM

## 2019-03-04 ENCOUNTER — Other Ambulatory Visit: Payer: Federal, State, Local not specified - PPO

## 2019-03-07 ENCOUNTER — Ambulatory Visit
Admission: RE | Admit: 2019-03-07 | Discharge: 2019-03-07 | Disposition: A | Discharge status: Home / Self Care | Payer: Federal, State, Local not specified - PPO | Source: Ambulatory Visit | Attending: Neurosurgery | Admitting: Neurosurgery

## 2019-03-07 DIAGNOSIS — D164 Benign neoplasm of bones of skull and face: Secondary | ICD-10-CM | POA: Diagnosis not present

## 2019-03-17 DIAGNOSIS — D164 Benign neoplasm of bones of skull and face: Secondary | ICD-10-CM | POA: Diagnosis not present

## 2019-03-23 DIAGNOSIS — F3341 Major depressive disorder, recurrent, in partial remission: Secondary | ICD-10-CM | POA: Diagnosis not present

## 2019-03-23 DIAGNOSIS — F422 Mixed obsessional thoughts and acts: Secondary | ICD-10-CM | POA: Diagnosis not present

## 2019-03-23 DIAGNOSIS — F411 Generalized anxiety disorder: Secondary | ICD-10-CM | POA: Diagnosis not present

## 2019-03-31 DIAGNOSIS — F411 Generalized anxiety disorder: Secondary | ICD-10-CM | POA: Diagnosis not present

## 2019-03-31 DIAGNOSIS — F3341 Major depressive disorder, recurrent, in partial remission: Secondary | ICD-10-CM | POA: Diagnosis not present

## 2019-03-31 DIAGNOSIS — F422 Mixed obsessional thoughts and acts: Secondary | ICD-10-CM | POA: Diagnosis not present

## 2019-04-12 DIAGNOSIS — F3341 Major depressive disorder, recurrent, in partial remission: Secondary | ICD-10-CM | POA: Diagnosis not present

## 2019-04-12 DIAGNOSIS — F422 Mixed obsessional thoughts and acts: Secondary | ICD-10-CM | POA: Diagnosis not present

## 2019-04-12 DIAGNOSIS — F411 Generalized anxiety disorder: Secondary | ICD-10-CM | POA: Diagnosis not present

## 2019-04-20 ENCOUNTER — Telehealth: Payer: Self-pay

## 2019-04-20 ENCOUNTER — Ambulatory Visit: Payer: Self-pay | Admitting: Physician Assistant

## 2019-04-20 NOTE — Telephone Encounter (Signed)
Patient's mother Willette Cluster calling that patient woke up this morning with very swollen lip with and it looks like some pus is in it. No other symptoms.Just painful. And red. Mother requesting and appointment for tomorrow. Advised mother to patient some benadryl for the swelling and that she can take something for the pain and apply some warm compresses.

## 2019-04-20 NOTE — Telephone Encounter (Signed)
Ok is she scheduled for tomorrow.

## 2019-04-21 ENCOUNTER — Ambulatory Visit (INDEPENDENT_AMBULATORY_CARE_PROVIDER_SITE_OTHER): Payer: Federal, State, Local not specified - PPO | Admitting: Physician Assistant

## 2019-04-21 ENCOUNTER — Encounter: Payer: Self-pay | Admitting: Physician Assistant

## 2019-04-21 ENCOUNTER — Other Ambulatory Visit: Payer: Self-pay

## 2019-04-21 VITALS — BP 110/74 | HR 92 | Temp 98.2°F | Resp 16 | Wt 118.0 lb

## 2019-04-21 DIAGNOSIS — B001 Herpesviral vesicular dermatitis: Secondary | ICD-10-CM | POA: Diagnosis not present

## 2019-04-21 DIAGNOSIS — K130 Diseases of lips: Secondary | ICD-10-CM | POA: Diagnosis not present

## 2019-04-21 MED ORDER — DOXYCYCLINE HYCLATE 100 MG PO TABS: 100.0000 mg | ORAL_TABLET | Freq: Two times a day (BID) | ORAL | 0 refills | Status: DC

## 2019-04-21 MED ORDER — VALACYCLOVIR HCL 1 G PO TABS: ORAL_TABLET | ORAL | 5 refills | Status: DC

## 2019-04-21 NOTE — Patient Instructions (Signed)
Cold Sore    A cold sore, also called a fever blister, is a small, fluid-filled sore that forms inside of the mouth or on the lips, gums, nose, chin, or cheeks. Cold sores can spread to other parts of the body, such as the eyes or fingers.  Cold sores can spread from person to person (are contagious) until the sores crust over completely. Most cold sores go away within 2 weeks.  What are the causes?  Cold sores are caused by a virus (herpes simplex virus type 1, HSV-1). The virus can spread from person to person through close contact, such as through:   Kissing.   Touching the affected area.   Sharing personal items such as lip balm, razors, a drinking glass, or eating utensils.  What increases the risk?  You are more likely to develop this condition if you:   Are tired, stressed, or sick.   Are having your period (menstruating).   Are pregnant.   Take certain medicines.   Are out in cold weather or get too much sun.  What are the signs or symptoms?  Symptoms of a cold sore outbreak go through different stages. These are the stages of a cold sore:   Tingling, itching, or burning is felt 1-2 days before the outbreak.   Fluid-filled blisters appear on the lips, inside the mouth, on the nose, or on the cheeks.   The blisters start to ooze clear fluid.   The blisters dry up, and a yellow crust appears in their place.   The crust falls off.  In some cases, other symptoms can develop during a cold sore outbreak. These can include:   Fever.   Sore throat.   Headache.   Muscle aches.   Swollen neck glands.  How is this treated?  There is no cure for cold sores or the virus that causes them. There is also no vaccine to prevent the virus. Most cold sores go away on their own without treatment within 2 weeks. Your doctor may prescribe medicines to:   Help with pain.   Keep the virus from growing.   Help you heal faster.  Medicines may be in the form of creams, gels, pills, or a shot.  Follow these  instructions at home:  Medicines   Take or apply over-the-counter and prescription medicines only as told by your doctor.   Use a cotton-tip swab to apply creams or gels to your sores.   Ask your doctor if you can take lysine supplements. These may help with healing.  Sore care     Do not touch the sores or pick the scabs.   Wash your hands often. Do not touch your eyes without washing your hands first.   Keep the sores clean and dry.   If told, put ice on the sores:  ? Put ice in a plastic bag.  ? Place a towel between your skin and the bag.  ? Leave the ice on for 20 minutes, 2-3 times a day.  Eating and drinking   Eat a soft, bland diet. Avoid eating hot, cold, or salty foods. These can hurt your mouth.   Use a straw if it hurts to drink out of a glass.   Eat foods that have a lot of lysine in them. These include meat, fish, and dairy products.   Avoid sugary foods, chocolates, nuts, and grains. These foods have a high amount of a substance (arginine) that can cause the virus to grow.    Lifestyle   Do not kiss, have oral sex, or share personal items until your sores heal.   Stress, poor sleep, and being out in the sun can trigger outbreaks. Make sure you:  ? Do activities that help you relax, such as deep breathing exercises or meditation.  ? Get enough sleep.  ? Apply sunscreen on your lips before you go out in the sun.  Contact a doctor if:   You have symptoms for more than 2 weeks.   You have pus coming from the sores.   You have redness that is spreading.   You have pain or irritation in your eye.   You get sores on your genitals.   Your sores do not heal within 2 weeks.   You get cold sores often.  Get help right away if:   You have a fever and your symptoms suddenly get worse.   You have a headache and confusion.   You have tiredness (fatigue).   You do not want to eat as much as normal (loss of appetite).   You have a stiff neck or are sensitive to light.  Summary   A cold sore is  a small, fluid-filled sore that forms inside of the mouth or on the lips, gums, nose, chin, or cheeks.   Cold sores can spread from person to person (are contagious) until the sores crust over completely. Most cold sores go away within 2 weeks.   Wash your hands often. Do not touch your eyes without washing your hands first.   Do not kiss, have oral sex, or share personal items until your sores heal.   Contact a doctor if your sores do not heal within 2 weeks.  This information is not intended to replace advice given to you by your health care provider. Make sure you discuss any questions you have with your health care provider.  Document Released: 06/15/2012 Document Revised: 05/17/2018 Document Reviewed: 05/17/2018  Elsevier Interactive Patient Education  2019 [employer]

## 2019-04-21 NOTE — Progress Notes (Signed)
Patient: Rhonda Cole Female    DOB: 1996-12-20   23 y.o.   MRN: [id number] Visit Date: 04/21/2019  Today's Provider: Mar Daring, PA-C   No chief complaint on file.  Subjective:     HPI  Patient has had swelling on her bottom lip for 2 days. Patient states she has a sore that has pus in it. Patient also has body aches and nausea. Patient has been taking Benadryl with no relief. She does get cold sores.  Allergies  Allergen Reactions  . Augmentin [Amoxicillin-Pot Clavulanate] Rash     Current Outpatient Medications:  .  hydrOXYzine (ATARAX/VISTARIL) 25 MG tablet, take 1-2 tablets by mouth at bedtime if needed for anxiety, Disp: , Rfl: 0 .  norethindrone-ethinyl estradiol-iron (BLISOVI FE 1.5/30) 1.5-30 MG-MCG tablet, Take 1 tablet by mouth daily., Disp: 3 Package, Rfl: 3 .  ondansetron (ZOFRAN) 4 MG tablet, Take 1 tablet (4 mg total) by mouth every 8 (eight) hours as needed for nausea or vomiting., Disp: 12 tablet, Rfl: 0 .  sertraline (ZOLOFT) 100 MG tablet, TK 1 T PO D, Disp: , Rfl: 1 .  loteprednol (LOTEMAX) 0.5 % ophthalmic suspension, INSTILL 1 DROP INTO BOTH EYES QID UTD, Disp: , Rfl: 0  Review of Systems  Constitutional: Negative for appetite change, chills, fatigue and fever.  Respiratory: Negative for chest tightness and shortness of breath.   Cardiovascular: Negative for chest pain and palpitations.  Gastrointestinal: Negative for abdominal pain, nausea and vomiting.  Neurological: Negative for dizziness and weakness.    Social History   Tobacco Use  . Smoking status: Never Smoker  . Smokeless tobacco: Never Used  Substance Use Topics  . Alcohol use: No      Objective:   BP 110/74 (BP Location: Left Arm, Patient Position: Sitting, Cuff Size: Large)   Pulse 92   Temp 98.2 F (36.8 C) (Oral)   Resp 16   Wt 118 lb (53.5 kg)   SpO2 99%   BMI 19.05 kg/m  Vitals:   04/21/19 0830  BP: 110/74  Pulse: 92  Resp: 16  Temp: 98.2 F (36.8 C)   TempSrc: Oral  SpO2: 99%  Weight: 118 lb (53.5 kg)     Physical Exam Vitals signs reviewed.  Constitutional:      General: She is not in acute distress.    Appearance: Normal appearance. She is well-developed and normal weight. She is not ill-appearing or diaphoretic.  HENT:     Mouth/Throat:     Lips: Lesions present.     Mouth: Mucous membranes are moist.   Neck:     Musculoskeletal: Normal range of motion and neck supple.  Cardiovascular:     Rate and Rhythm: Normal rate and regular rhythm.     Heart sounds: Normal heart sounds. No murmur. No friction rub. No gallop.   Pulmonary:     Effort: Pulmonary effort is normal. No respiratory distress.     Breath sounds: Normal breath sounds. No wheezing or rales.  Lymphadenopathy:     Cervical: No cervical adenopathy.  Neurological:     Mental Status: She is alert.         Assessment & Plan    1. Recurrent cold sores Valtrex given as below for cold sores to use prn. - valACYclovir (VALTREX) 1000 MG tablet; Take 2 tabs at onset of cold sore and repeat every 12 hrs prn  Dispense: 30 tablet; Refill: 5  2. Infection of lip Superimposed  infection on cold sore. Will treat with doxycycline as below.  - doxycycline (VIBRA-TABS) 100 MG tablet; Take 1 tablet (100 mg total) by mouth 2 (two) times daily.  Dispense: 10 tablet; Refill: 0     Mar Daring, PA-C  [facility]

## 2019-04-21 NOTE — Telephone Encounter (Signed)
Patient was seen in office

## 2019-04-25 DIAGNOSIS — F5101 Primary insomnia: Secondary | ICD-10-CM | POA: Diagnosis not present

## 2019-04-25 DIAGNOSIS — F411 Generalized anxiety disorder: Secondary | ICD-10-CM | POA: Diagnosis not present

## 2019-04-25 DIAGNOSIS — F330 Major depressive disorder, recurrent, mild: Secondary | ICD-10-CM | POA: Diagnosis not present

## 2019-05-06 DIAGNOSIS — F5101 Primary insomnia: Secondary | ICD-10-CM | POA: Diagnosis not present

## 2019-05-06 DIAGNOSIS — F330 Major depressive disorder, recurrent, mild: Secondary | ICD-10-CM | POA: Diagnosis not present

## 2019-05-06 DIAGNOSIS — F411 Generalized anxiety disorder: Secondary | ICD-10-CM | POA: Diagnosis not present

## 2019-05-12 DIAGNOSIS — F411 Generalized anxiety disorder: Secondary | ICD-10-CM | POA: Diagnosis not present

## 2019-05-12 DIAGNOSIS — F5105 Insomnia due to other mental disorder: Secondary | ICD-10-CM | POA: Diagnosis not present

## 2019-05-12 DIAGNOSIS — F330 Major depressive disorder, recurrent, mild: Secondary | ICD-10-CM | POA: Diagnosis not present

## 2019-05-12 DIAGNOSIS — F5101 Primary insomnia: Secondary | ICD-10-CM | POA: Diagnosis not present

## 2019-05-18 DIAGNOSIS — F330 Major depressive disorder, recurrent, mild: Secondary | ICD-10-CM | POA: Diagnosis not present

## 2019-05-18 DIAGNOSIS — F5101 Primary insomnia: Secondary | ICD-10-CM | POA: Diagnosis not present

## 2019-05-18 DIAGNOSIS — F411 Generalized anxiety disorder: Secondary | ICD-10-CM | POA: Diagnosis not present

## 2019-05-25 DIAGNOSIS — F411 Generalized anxiety disorder: Secondary | ICD-10-CM | POA: Diagnosis not present

## 2019-05-25 DIAGNOSIS — F330 Major depressive disorder, recurrent, mild: Secondary | ICD-10-CM | POA: Diagnosis not present

## 2019-05-25 DIAGNOSIS — F5101 Primary insomnia: Secondary | ICD-10-CM | POA: Diagnosis not present

## 2019-06-01 DIAGNOSIS — F330 Major depressive disorder, recurrent, mild: Secondary | ICD-10-CM | POA: Diagnosis not present

## 2019-06-01 DIAGNOSIS — F411 Generalized anxiety disorder: Secondary | ICD-10-CM | POA: Diagnosis not present

## 2019-06-01 DIAGNOSIS — F5101 Primary insomnia: Secondary | ICD-10-CM | POA: Diagnosis not present

## 2019-06-03 DIAGNOSIS — F411 Generalized anxiety disorder: Secondary | ICD-10-CM | POA: Diagnosis not present

## 2019-06-03 DIAGNOSIS — F5105 Insomnia due to other mental disorder: Secondary | ICD-10-CM | POA: Diagnosis not present

## 2019-06-03 DIAGNOSIS — F330 Major depressive disorder, recurrent, mild: Secondary | ICD-10-CM | POA: Diagnosis not present

## 2019-06-08 DIAGNOSIS — F5101 Primary insomnia: Secondary | ICD-10-CM | POA: Diagnosis not present

## 2019-06-08 DIAGNOSIS — F411 Generalized anxiety disorder: Secondary | ICD-10-CM | POA: Diagnosis not present

## 2019-06-08 DIAGNOSIS — F330 Major depressive disorder, recurrent, mild: Secondary | ICD-10-CM | POA: Diagnosis not present

## 2019-06-16 DIAGNOSIS — F330 Major depressive disorder, recurrent, mild: Secondary | ICD-10-CM | POA: Diagnosis not present

## 2019-06-16 DIAGNOSIS — F411 Generalized anxiety disorder: Secondary | ICD-10-CM | POA: Diagnosis not present

## 2019-06-16 DIAGNOSIS — F5101 Primary insomnia: Secondary | ICD-10-CM | POA: Diagnosis not present

## 2019-06-22 DIAGNOSIS — F330 Major depressive disorder, recurrent, mild: Secondary | ICD-10-CM | POA: Diagnosis not present

## 2019-06-22 DIAGNOSIS — F5101 Primary insomnia: Secondary | ICD-10-CM | POA: Diagnosis not present

## 2019-06-22 DIAGNOSIS — F411 Generalized anxiety disorder: Secondary | ICD-10-CM | POA: Diagnosis not present

## 2019-06-26 DIAGNOSIS — F5105 Insomnia due to other mental disorder: Secondary | ICD-10-CM | POA: Diagnosis not present

## 2019-06-26 DIAGNOSIS — F411 Generalized anxiety disorder: Secondary | ICD-10-CM | POA: Diagnosis not present

## 2019-06-26 DIAGNOSIS — F330 Major depressive disorder, recurrent, mild: Secondary | ICD-10-CM | POA: Diagnosis not present

## 2019-07-04 DIAGNOSIS — F5101 Primary insomnia: Secondary | ICD-10-CM | POA: Diagnosis not present

## 2019-07-04 DIAGNOSIS — F330 Major depressive disorder, recurrent, mild: Secondary | ICD-10-CM | POA: Diagnosis not present

## 2019-07-04 DIAGNOSIS — F411 Generalized anxiety disorder: Secondary | ICD-10-CM | POA: Diagnosis not present

## 2019-07-14 DIAGNOSIS — F411 Generalized anxiety disorder: Secondary | ICD-10-CM | POA: Diagnosis not present

## 2019-07-14 DIAGNOSIS — F330 Major depressive disorder, recurrent, mild: Secondary | ICD-10-CM | POA: Diagnosis not present

## 2019-07-14 DIAGNOSIS — F5101 Primary insomnia: Secondary | ICD-10-CM | POA: Diagnosis not present

## 2019-07-26 DIAGNOSIS — F411 Generalized anxiety disorder: Secondary | ICD-10-CM | POA: Diagnosis not present

## 2019-07-26 DIAGNOSIS — F5101 Primary insomnia: Secondary | ICD-10-CM | POA: Diagnosis not present

## 2019-07-26 DIAGNOSIS — F330 Major depressive disorder, recurrent, mild: Secondary | ICD-10-CM | POA: Diagnosis not present

## 2019-08-08 DIAGNOSIS — F5101 Primary insomnia: Secondary | ICD-10-CM | POA: Diagnosis not present

## 2019-08-08 DIAGNOSIS — F411 Generalized anxiety disorder: Secondary | ICD-10-CM | POA: Diagnosis not present

## 2019-08-08 DIAGNOSIS — F330 Major depressive disorder, recurrent, mild: Secondary | ICD-10-CM | POA: Diagnosis not present

## 2019-08-10 ENCOUNTER — Other Ambulatory Visit: Payer: Self-pay | Admitting: Obstetrics and Gynecology

## 2019-08-10 DIAGNOSIS — Z3041 Encounter for surveillance of contraceptive pills: Secondary | ICD-10-CM

## 2019-08-10 DIAGNOSIS — N946 Dysmenorrhea, unspecified: Secondary | ICD-10-CM

## 2019-08-14 ENCOUNTER — Other Ambulatory Visit: Payer: Self-pay | Admitting: Obstetrics and Gynecology

## 2019-08-14 DIAGNOSIS — Z3041 Encounter for surveillance of contraceptive pills: Secondary | ICD-10-CM

## 2019-08-14 DIAGNOSIS — N946 Dysmenorrhea, unspecified: Secondary | ICD-10-CM

## 2019-08-15 ENCOUNTER — Telehealth: Payer: Self-pay

## 2019-08-15 DIAGNOSIS — Z3041 Encounter for surveillance of contraceptive pills: Secondary | ICD-10-CM

## 2019-08-15 DIAGNOSIS — N946 Dysmenorrhea, unspecified: Secondary | ICD-10-CM

## 2019-08-15 MED ORDER — NORETHIN ACE-ETH ESTRAD-FE 1.5-30 MG-MCG PO TABS: 1.0000 | ORAL_TABLET | Freq: Every day | ORAL | 0 refills | Status: DC

## 2019-08-15 NOTE — Telephone Encounter (Signed)
Pt is scheduled for annual.  Refill of bc eRx'd.

## 2019-08-15 NOTE — Telephone Encounter (Signed)
Pt is scheduled °

## 2019-08-15 NOTE — Telephone Encounter (Signed)
Pt calling for a one month refill of her birthcontrol and would like an appt within that month.  [phone]

## 2019-08-17 DIAGNOSIS — F411 Generalized anxiety disorder: Secondary | ICD-10-CM | POA: Diagnosis not present

## 2019-08-17 DIAGNOSIS — F330 Major depressive disorder, recurrent, mild: Secondary | ICD-10-CM | POA: Diagnosis not present

## 2019-08-17 DIAGNOSIS — F5101 Primary insomnia: Secondary | ICD-10-CM | POA: Diagnosis not present

## 2019-08-22 DIAGNOSIS — F5105 Insomnia due to other mental disorder: Secondary | ICD-10-CM | POA: Diagnosis not present

## 2019-08-22 DIAGNOSIS — F411 Generalized anxiety disorder: Secondary | ICD-10-CM | POA: Diagnosis not present

## 2019-08-22 DIAGNOSIS — F330 Major depressive disorder, recurrent, mild: Secondary | ICD-10-CM | POA: Diagnosis not present

## 2019-08-23 DIAGNOSIS — F330 Major depressive disorder, recurrent, mild: Secondary | ICD-10-CM | POA: Diagnosis not present

## 2019-08-23 DIAGNOSIS — F411 Generalized anxiety disorder: Secondary | ICD-10-CM | POA: Diagnosis not present

## 2019-08-23 DIAGNOSIS — F5101 Primary insomnia: Secondary | ICD-10-CM | POA: Diagnosis not present

## 2019-09-01 DIAGNOSIS — F5101 Primary insomnia: Secondary | ICD-10-CM | POA: Diagnosis not present

## 2019-09-01 DIAGNOSIS — F330 Major depressive disorder, recurrent, mild: Secondary | ICD-10-CM | POA: Diagnosis not present

## 2019-09-01 DIAGNOSIS — F411 Generalized anxiety disorder: Secondary | ICD-10-CM | POA: Diagnosis not present

## 2019-09-06 ENCOUNTER — Telehealth: Payer: Self-pay

## 2019-09-06 ENCOUNTER — Other Ambulatory Visit: Payer: Self-pay

## 2019-09-06 DIAGNOSIS — Z20822 Contact with and (suspected) exposure to covid-19: Secondary | ICD-10-CM

## 2019-09-06 DIAGNOSIS — R6889 Other general symptoms and signs: Secondary | ICD-10-CM | POA: Diagnosis not present

## 2019-09-06 NOTE — Telephone Encounter (Signed)
Patient was advised and expressed understanding.  

## 2019-09-06 NOTE — Telephone Encounter (Signed)
  Pt called reporting she was a wedding over the weekend.  She is concerned she may have been exposed to COVID-19.  She does not have any symptoms right now but would like to be tested.     Contact number:  [phone]  Thanks,   -Mickel Baas

## 2019-09-06 NOTE — Telephone Encounter (Signed)
She can go to [facility] for testing at her convenience between 8-330p. She must isolate until results received. If she develops symptoms even after testing she may require to be retested.

## 2019-09-07 LAB — NOVEL CORONAVIRUS, NAA: SARS-CoV-2, NAA: NOT DETECTED

## 2019-09-12 DIAGNOSIS — F411 Generalized anxiety disorder: Secondary | ICD-10-CM | POA: Diagnosis not present

## 2019-09-12 DIAGNOSIS — F330 Major depressive disorder, recurrent, mild: Secondary | ICD-10-CM | POA: Diagnosis not present

## 2019-09-12 DIAGNOSIS — F5101 Primary insomnia: Secondary | ICD-10-CM | POA: Diagnosis not present

## 2019-09-13 ENCOUNTER — Other Ambulatory Visit: Payer: Self-pay

## 2019-09-13 ENCOUNTER — Encounter: Payer: Self-pay | Admitting: Obstetrics and Gynecology

## 2019-09-13 ENCOUNTER — Ambulatory Visit (INDEPENDENT_AMBULATORY_CARE_PROVIDER_SITE_OTHER): Payer: Federal, State, Local not specified - PPO | Admitting: Obstetrics and Gynecology

## 2019-09-13 ENCOUNTER — Other Ambulatory Visit (HOSPITAL_COMMUNITY)
Admission: RE | Admit: 2019-09-13 | Discharge: 2019-09-13 | Disposition: A | Discharge status: Home / Self Care | Payer: Federal, State, Local not specified - PPO | Source: Ambulatory Visit | Attending: Obstetrics and Gynecology | Admitting: Obstetrics and Gynecology

## 2019-09-13 VITALS — BP 100/70 | Ht 65.5 in | Wt 135.0 lb

## 2019-09-13 DIAGNOSIS — N946 Dysmenorrhea, unspecified: Secondary | ICD-10-CM

## 2019-09-13 DIAGNOSIS — Z113 Encounter for screening for infections with a predominantly sexual mode of transmission: Secondary | ICD-10-CM

## 2019-09-13 DIAGNOSIS — Z124 Encounter for screening for malignant neoplasm of cervix: Secondary | ICD-10-CM | POA: Insufficient documentation

## 2019-09-13 DIAGNOSIS — Z01419 Encounter for gynecological examination (general) (routine) without abnormal findings: Secondary | ICD-10-CM | POA: Diagnosis not present

## 2019-09-13 DIAGNOSIS — Z3041 Encounter for surveillance of contraceptive pills: Secondary | ICD-10-CM

## 2019-09-13 MED ORDER — NORETHIN ACE-ETH ESTRAD-FE 1.5-30 MG-MCG PO TABS: 1.0000 | ORAL_TABLET | Freq: Every day | ORAL | 3 refills | Status: DC

## 2019-09-13 NOTE — Progress Notes (Signed)
Chief Complaint  Patient presents with  . Gynecologic Exam    HPI:      [name] is a 23 y.o. G0P0000 who LMP was Patient's last menstrual period was 09/05/2019 (exact date)., presents today for her annual examination.  Her menses are regular every 28-30 days, lasting 4 days.  Dysmenorrhea mild, occurring first 1-2 days of flow. She started OCPs for dysmen and cycle control a couple yrs ago. Cramps significantly improved with OCPs. She does not have intermenstrual bleeding.  Sex activity: single partner currently--contracpetion: OCPs. Last pap: never No hx of STDs.  There is a FH of breast cancer in her mat grt aunt, genetic testing not indicated. There is no FH of ovarian cancer. The patient does do self-breast exams.  Tobacco use: The patient denies current or previous tobacco use. Alcohol use: none  No drug use Exercise: moderately active  She does get adequate calcium but not Vitamin D in her diet.  She has completed [place] vaccine series.    Past Medical History:  Diagnosis Date  . Anxiety   . Mild depression ([place])   . Osteoma of skull   . Vaccine for human papilloma virus (HPV) types 6, 11, 16, and 18 administered     Past Surgical History:  Procedure Laterality Date  . HERNIA REPAIR  1999   double when she was a baby  . INGUINAL HERNIA REPAIR Bilateral 2001  . TYMPANOSTOMY TUBE PLACEMENT  2000   x2  . WISDOM TOOTH EXTRACTION  2018    Family History  Problem Relation Age of Onset  . Migraines Mother   . Hyperlipidemia Father   . Hypertension Father   . Arthritis Father   . Asthma Sister   . Alzheimer's disease Maternal Grandmother   . Arthritis Maternal Grandmother   . Hyperlipidemia Paternal Grandfather   . Hypertension Paternal Grandfather   . Cancer Paternal Grandfather        COLON; LUNG; LYMPHOMA; ?  . Heart failure Paternal Grandfather   . Heart attack Paternal Grandfather   . Cancer Other        BREAST    Social History    Socioeconomic History  . Marital status: Single    Spouse name: Not on file  . Number of children: Not on file  . Years of education: Not on file  . Highest education level: Not on file  Occupational History  . Not on file  Social Needs  . Financial resource strain: Not on file  . Food insecurity    Worry: Not on file    Inability: Not on file  . Transportation needs    Medical: Not on file    Non-medical: Not on file  Tobacco Use  . Smoking status: Never Smoker  . Smokeless tobacco: Never Used  Substance and Sexual Activity  . Alcohol use: No  . Drug use: No  . Sexual activity: Yes    Birth control/protection: Pill  Lifestyle  . Physical activity    Days per week: Not on file    Minutes per session: Not on file  . Stress: Not on file  Relationships  . Social Herbalist on phone: Not on file    Gets together: Not on file    Attends religious service: Not on file    Active member of club or organization: Not on file    Attends meetings of clubs or organizations: Not on file    Relationship status: Not  on file  . Intimate partner violence    Fear of current or ex partner: Not on file    Emotionally abused: Not on file    Physically abused: Not on file    Forced sexual activity: Not on file  Other Topics Concern  . Not on file  Social History Narrative  . Not on file     Current Outpatient Medications:  .  hydrOXYzine (ATARAX/VISTARIL) 25 MG tablet, take 1-2 tablets by mouth at bedtime if needed for anxiety, Disp: , Rfl: 0 .  norethindrone-ethinyl estradiol-iron (BLISOVI FE 1.5/30) 1.5-30 MG-MCG tablet, Take 1 tablet by mouth daily., Disp: 3 Package, Rfl: 3 .  sertraline (ZOLOFT) 100 MG tablet, 200 mg daily. , Disp: , Rfl: 1  ROS:  Review of Systems  Constitutional: Negative for fatigue, fever and unexpected weight change.  Respiratory: Negative for cough, shortness of breath and wheezing.   Cardiovascular: Negative for chest pain, palpitations and  leg swelling.  Gastrointestinal: Negative for blood in stool, constipation, diarrhea, nausea and vomiting.  Endocrine: Negative for cold intolerance, heat intolerance and polyuria.  Genitourinary: Negative for dyspareunia, dysuria, flank pain, frequency, genital sores, hematuria, menstrual problem, pelvic pain, urgency, vaginal bleeding, vaginal discharge and vaginal pain.  Musculoskeletal: Negative for back pain, joint swelling and myalgias.  Skin: Negative for rash.  Neurological: Negative for dizziness, syncope, light-headedness, numbness and headaches.  Hematological: Negative for adenopathy.  Psychiatric/Behavioral: Positive for agitation and dysphoric mood. Negative for confusion, sleep disturbance and suicidal ideas. The patient is not nervous/anxious.      Objective: BP 100/70   Ht 5' 5.5" (1.664 m)   Wt 135 lb (61.2 kg)   LMP 09/05/2019 (Exact Date)   BMI 22.12 kg/m    Physical Exam Constitutional:      Appearance: She is well-developed.  Genitourinary:     Vulva, vagina, cervix, uterus, right adnexa and left adnexa normal.     No vulval lesion or tenderness noted.     No vaginal discharge, erythema or tenderness.     No cervical polyp.     Uterus is not enlarged or tender.     No right or left adnexal mass present.     Right adnexa not tender.     Left adnexa not tender.  HENT:     Head:   Neck:     Musculoskeletal: Normal range of motion.     Thyroid: No thyromegaly.  Cardiovascular:     Rate and Rhythm: Normal rate and regular rhythm.     Heart sounds: Normal heart sounds. No murmur.  Pulmonary:     Effort: Pulmonary effort is normal.     Breath sounds: Normal breath sounds.  Chest:     Breasts:        Right: No mass, nipple discharge, skin change or tenderness.        Left: No mass, nipple discharge, skin change or tenderness.  Abdominal:     Palpations: Abdomen is soft.     Tenderness: There is no abdominal tenderness. There is no guarding.   Musculoskeletal: Normal range of motion.  Neurological:     General: No focal deficit present.     Mental Status: She is alert and oriented to person, place, and time.     Cranial Nerves: No cranial nerve deficit.  Skin:    General: Skin is warm and dry.  Psychiatric:        Mood and Affect: Mood normal.  Behavior: Behavior normal.        Thought Content: Thought content normal.        Judgment: Judgment normal.  Vitals signs reviewed.     Assessment/Plan: Encounter for annual routine gynecological examination  Cervical cancer screening - Plan: Cytology - PAP  Screening for STD (sexually transmitted disease) - Plan: Cytology - PAP  Encounter for surveillance of contraceptive pills - OCP RF - Plan: norethindrone-ethinyl estradiol-iron (BLISOVI FE 1.5/30) 1.5-30 MG-MCG tablet  Dysmenorrhea - Sx improved with OCPs. Cont meds. - Plan: norethindrone-ethinyl estradiol-iron (BLISOVI FE 1.5/30) 1.5-30 MG-MCG tablet             Meds ordered this encounter  Medications  . norethindrone-ethinyl estradiol-iron (BLISOVI FE 1.5/30) 1.5-30 MG-MCG tablet    Sig: Take 1 tablet by mouth daily.    Dispense:  3 Package    Refill:  3    Order Specific Question:   Supervising Provider    Answer:   Gae Dry [id number]    GYN counsel adequate intake of calcium and vitamin D     F/U  Return in about 1 year (around 09/12/2020).   B. , PA-C 09/13/2019 3:48 PM

## 2019-09-13 NOTE — Patient Instructions (Signed)
I value your feedback and entrusting us with your care. If you get a [facility] patient survey, I would appreciate you taking the time to let us know about your experience today. Thank you! 

## 2019-09-14 ENCOUNTER — Encounter: Payer: Self-pay | Admitting: Obstetrics and Gynecology

## 2019-09-17 LAB — CYTOLOGY - PAP
Adequacy: ABSENT
Diagnosis: NEGATIVE

## 2019-09-22 ENCOUNTER — Encounter: Payer: Self-pay | Admitting: Obstetrics and Gynecology

## 2019-09-22 DIAGNOSIS — F330 Major depressive disorder, recurrent, mild: Secondary | ICD-10-CM | POA: Diagnosis not present

## 2019-09-22 DIAGNOSIS — F411 Generalized anxiety disorder: Secondary | ICD-10-CM | POA: Diagnosis not present

## 2019-09-22 DIAGNOSIS — F5101 Primary insomnia: Secondary | ICD-10-CM | POA: Diagnosis not present

## 2019-09-23 DIAGNOSIS — F330 Major depressive disorder, recurrent, mild: Secondary | ICD-10-CM | POA: Diagnosis not present

## 2019-09-23 DIAGNOSIS — F411 Generalized anxiety disorder: Secondary | ICD-10-CM | POA: Diagnosis not present

## 2019-09-23 DIAGNOSIS — F5105 Insomnia due to other mental disorder: Secondary | ICD-10-CM | POA: Diagnosis not present

## 2019-10-03 DIAGNOSIS — F330 Major depressive disorder, recurrent, mild: Secondary | ICD-10-CM | POA: Diagnosis not present

## 2019-10-03 DIAGNOSIS — F411 Generalized anxiety disorder: Secondary | ICD-10-CM | POA: Diagnosis not present

## 2019-10-03 DIAGNOSIS — F5101 Primary insomnia: Secondary | ICD-10-CM | POA: Diagnosis not present

## 2019-10-05 ENCOUNTER — Ambulatory Visit: Payer: Federal, State, Local not specified - PPO

## 2019-10-27 DIAGNOSIS — F5101 Primary insomnia: Secondary | ICD-10-CM | POA: Diagnosis not present

## 2019-10-27 DIAGNOSIS — F330 Major depressive disorder, recurrent, mild: Secondary | ICD-10-CM | POA: Diagnosis not present

## 2019-10-27 DIAGNOSIS — F411 Generalized anxiety disorder: Secondary | ICD-10-CM | POA: Diagnosis not present

## 2019-10-31 ENCOUNTER — Other Ambulatory Visit: Payer: Self-pay | Admitting: *Deleted

## 2019-10-31 DIAGNOSIS — Z20822 Contact with and (suspected) exposure to covid-19: Secondary | ICD-10-CM

## 2019-11-01 LAB — NOVEL CORONAVIRUS, NAA: SARS-CoV-2, NAA: NOT DETECTED

## 2019-11-03 DIAGNOSIS — F5101 Primary insomnia: Secondary | ICD-10-CM | POA: Diagnosis not present

## 2019-11-03 DIAGNOSIS — F330 Major depressive disorder, recurrent, mild: Secondary | ICD-10-CM | POA: Diagnosis not present

## 2019-11-03 DIAGNOSIS — F411 Generalized anxiety disorder: Secondary | ICD-10-CM | POA: Diagnosis not present

## 2019-11-14 DIAGNOSIS — F411 Generalized anxiety disorder: Secondary | ICD-10-CM | POA: Diagnosis not present

## 2019-11-14 DIAGNOSIS — F330 Major depressive disorder, recurrent, mild: Secondary | ICD-10-CM | POA: Diagnosis not present

## 2019-11-14 DIAGNOSIS — F5101 Primary insomnia: Secondary | ICD-10-CM | POA: Diagnosis not present

## 2019-11-16 ENCOUNTER — Encounter: Payer: Self-pay | Admitting: Obstetrics and Gynecology

## 2019-11-18 ENCOUNTER — Ambulatory Visit: Payer: Federal, State, Local not specified - PPO | Admitting: Physician Assistant

## 2019-11-22 DIAGNOSIS — F330 Major depressive disorder, recurrent, mild: Secondary | ICD-10-CM | POA: Diagnosis not present

## 2019-11-22 DIAGNOSIS — F411 Generalized anxiety disorder: Secondary | ICD-10-CM | POA: Diagnosis not present

## 2019-11-22 DIAGNOSIS — F5105 Insomnia due to other mental disorder: Secondary | ICD-10-CM | POA: Diagnosis not present

## 2019-12-01 DIAGNOSIS — F330 Major depressive disorder, recurrent, mild: Secondary | ICD-10-CM | POA: Diagnosis not present

## 2019-12-01 DIAGNOSIS — F5101 Primary insomnia: Secondary | ICD-10-CM | POA: Diagnosis not present

## 2019-12-01 DIAGNOSIS — F411 Generalized anxiety disorder: Secondary | ICD-10-CM | POA: Diagnosis not present

## 2019-12-08 ENCOUNTER — Encounter: Payer: Federal, State, Local not specified - PPO | Admitting: Physician Assistant

## 2019-12-19 DIAGNOSIS — F330 Major depressive disorder, recurrent, mild: Secondary | ICD-10-CM | POA: Diagnosis not present

## 2019-12-19 DIAGNOSIS — F411 Generalized anxiety disorder: Secondary | ICD-10-CM | POA: Diagnosis not present

## 2019-12-19 DIAGNOSIS — F5101 Primary insomnia: Secondary | ICD-10-CM | POA: Diagnosis not present

## 2019-12-28 DIAGNOSIS — F330 Major depressive disorder, recurrent, mild: Secondary | ICD-10-CM | POA: Diagnosis not present

## 2019-12-28 DIAGNOSIS — F5101 Primary insomnia: Secondary | ICD-10-CM | POA: Diagnosis not present

## 2019-12-28 DIAGNOSIS — F411 Generalized anxiety disorder: Secondary | ICD-10-CM | POA: Diagnosis not present

## 2020-01-02 DIAGNOSIS — F330 Major depressive disorder, recurrent, mild: Secondary | ICD-10-CM | POA: Diagnosis not present

## 2020-01-02 DIAGNOSIS — F5101 Primary insomnia: Secondary | ICD-10-CM | POA: Diagnosis not present

## 2020-01-02 DIAGNOSIS — F411 Generalized anxiety disorder: Secondary | ICD-10-CM | POA: Diagnosis not present

## 2020-01-10 DIAGNOSIS — F5101 Primary insomnia: Secondary | ICD-10-CM | POA: Diagnosis not present

## 2020-01-10 DIAGNOSIS — F411 Generalized anxiety disorder: Secondary | ICD-10-CM | POA: Diagnosis not present

## 2020-01-10 DIAGNOSIS — F330 Major depressive disorder, recurrent, mild: Secondary | ICD-10-CM | POA: Diagnosis not present

## 2020-01-13 IMAGING — CT CT HEAD WO/W CM
3 of 5 series · 14 of 47 positions shown, 16 images · IV contrast (iopamidol)
Comparison: None.

CLINICAL DATA: Skull mass located behind the right ear, present for
several years though with pain in the past 3-4 months. Episodes of
dizziness and vomiting.

EXAM:
CT HEAD WITHOUT AND WITH CONTRAST
TECHNIQUE: Contiguous axial images were obtained from the base of the skull
through the vertex without and with intravenous contrast
CONTRAST:  75mL [id number] IOPAMIDOL ([id number]-255) INJECTION 61%

[Series 2: brain · axial · 0.33mm/px · z∈[-507,-402]mm · 8 of 28 slices shown, 10 images (1 of 3)]
[im 4/28  brain]
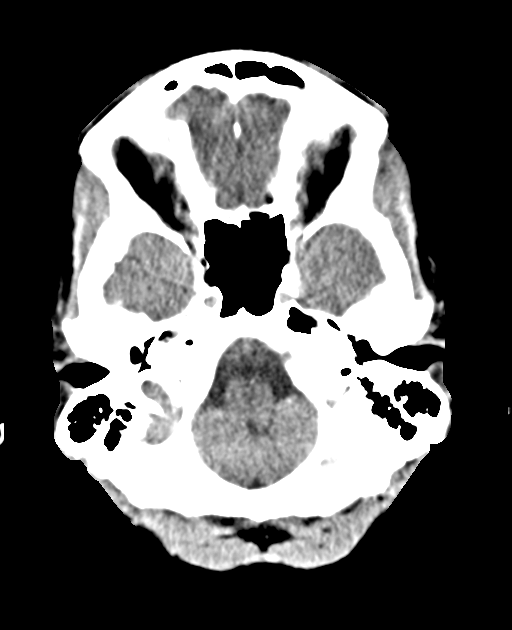
[im 4/28  bone]
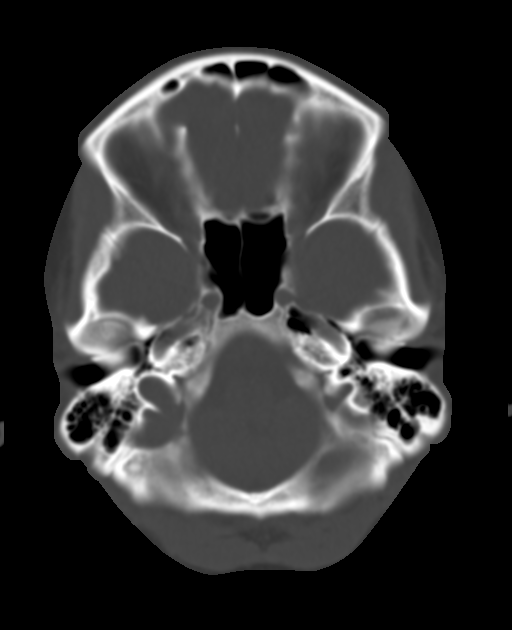
[im 7/28  brain]
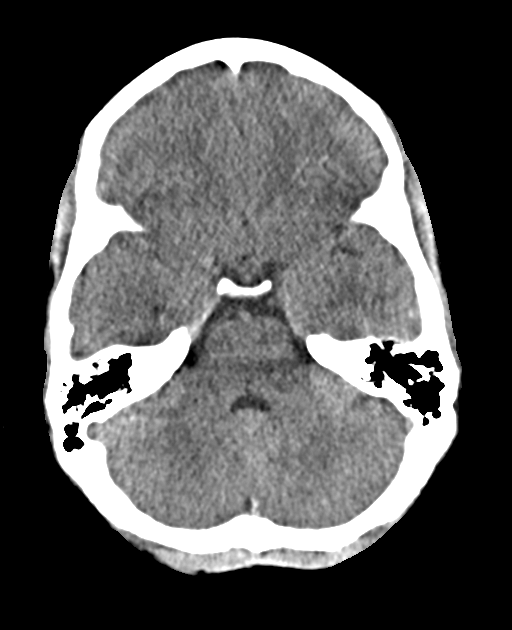
[im 10/28  brain]
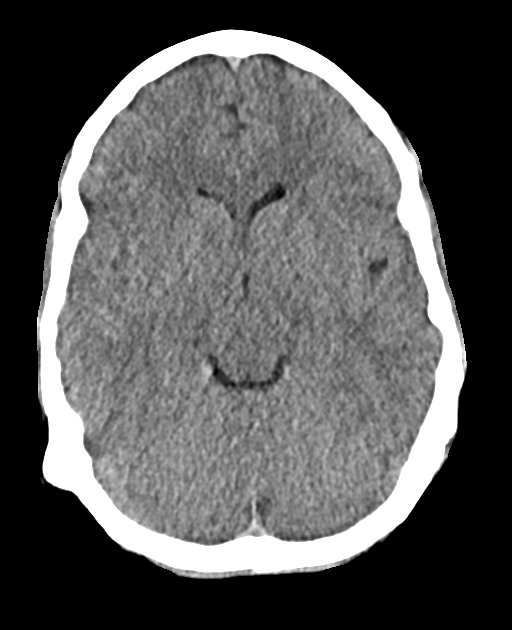
[im 13/28  brain]
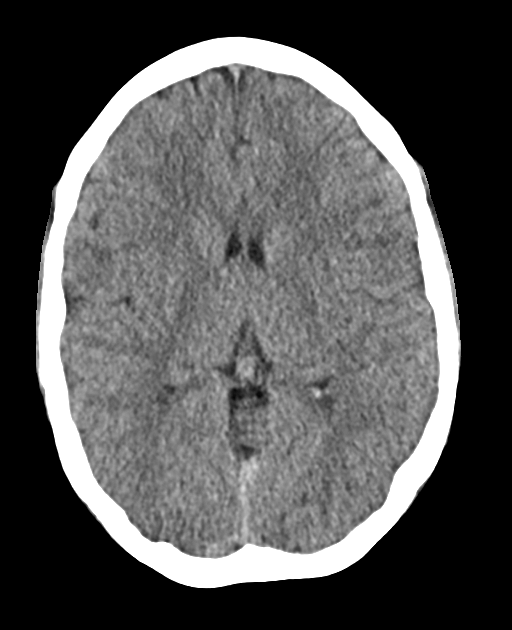
[im 16/28  brain]
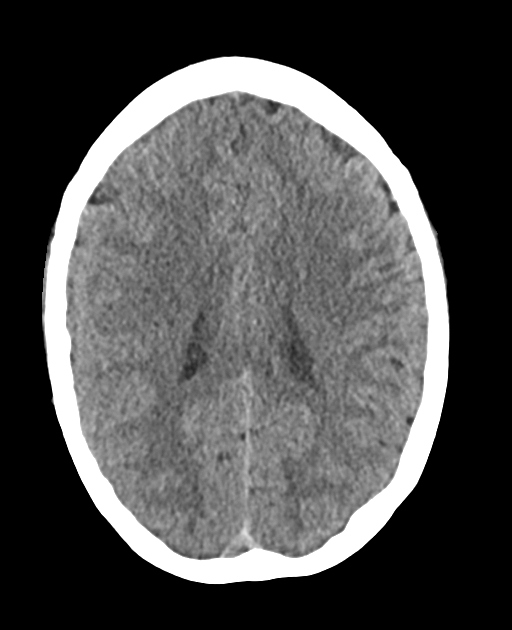
[im 16/28  bone]
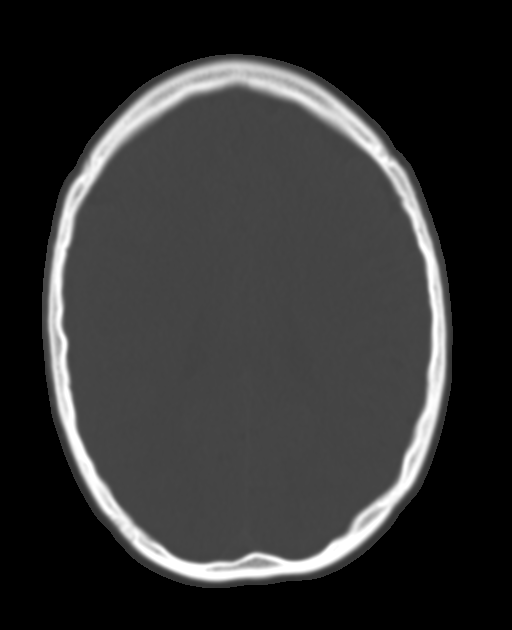
[im 19/28  brain]
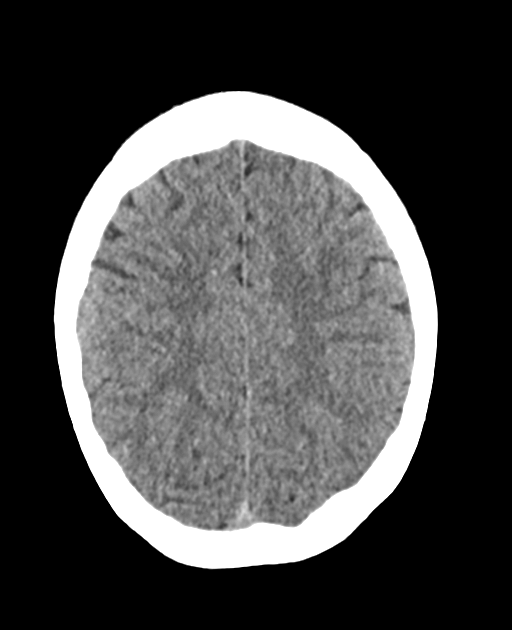
[im 22/28  brain]
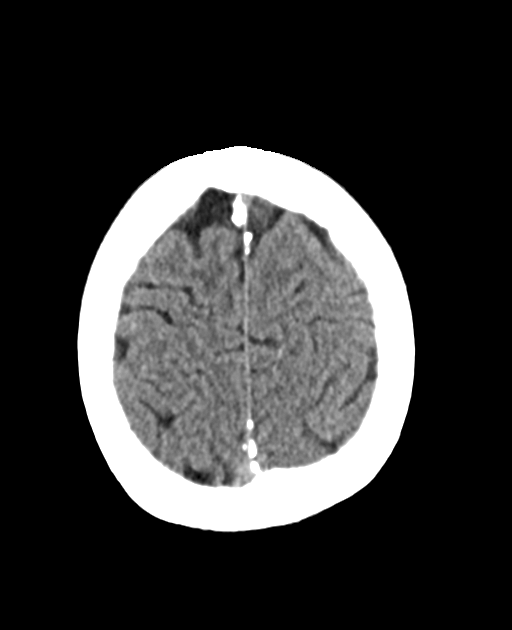
[im 25/28  brain]
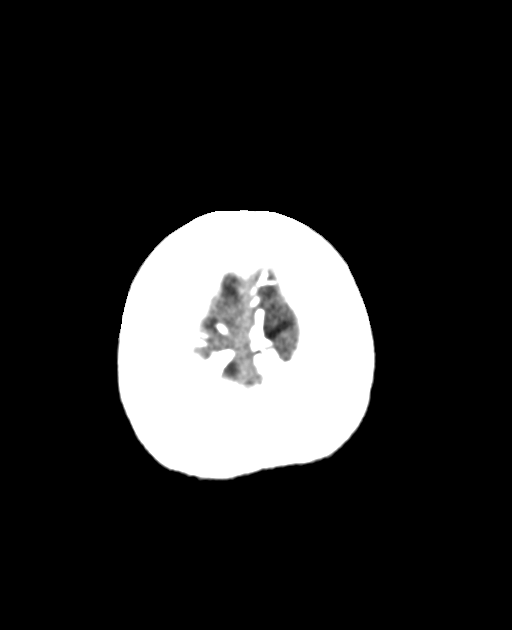

[Series 4: brain · axial · 0.33mm/px · z∈[-507,-477]mm · 3 of 28 slices shown (2 of 3)]
[im 4/28  brain]
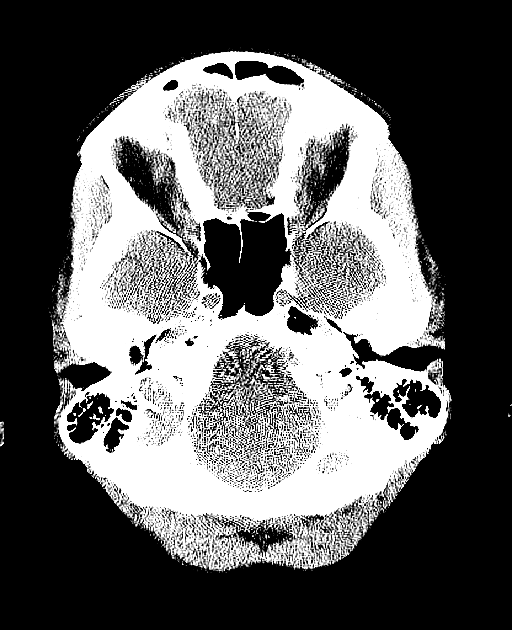
[im 7/28  brain]
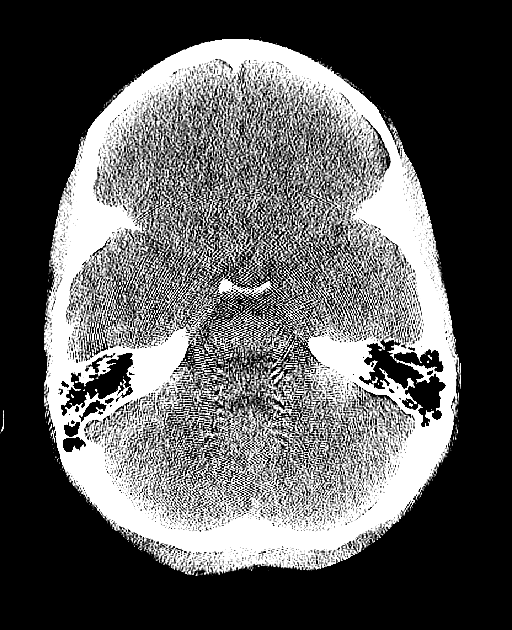
[im 10/28  brain]
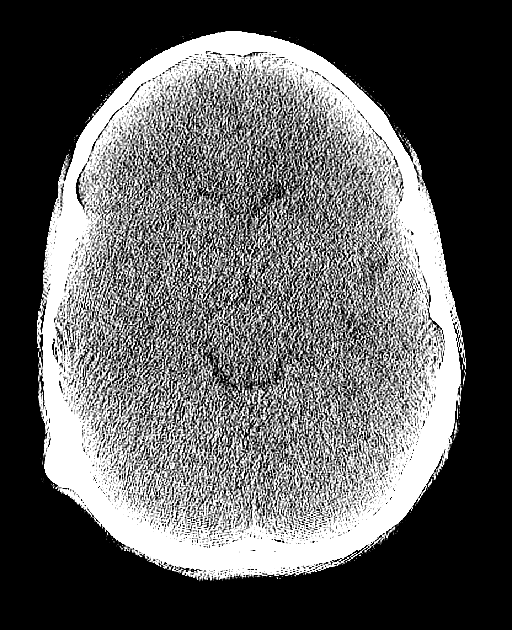

[Series 8: brain · coronal · 0.28mm/px · 3 of 68 slices shown (3 of 3)]
[im 23/68  brain]
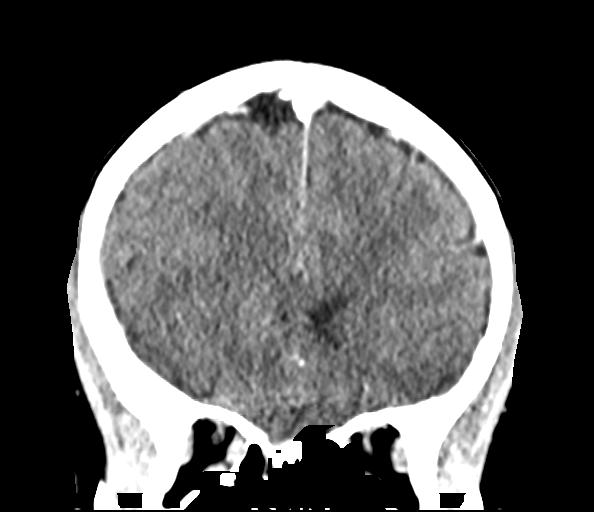
[im 30/68  brain]
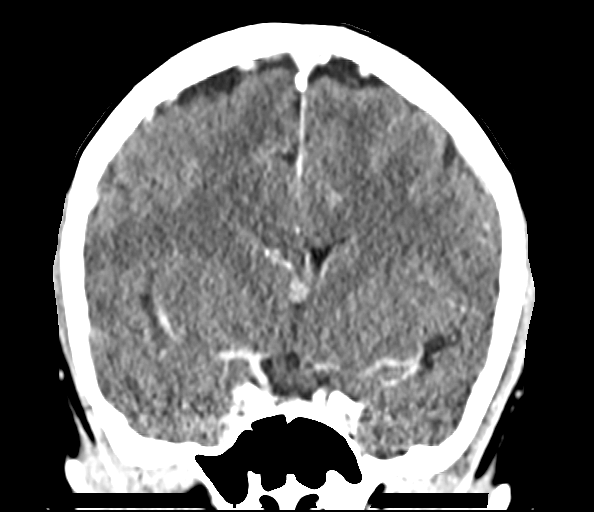
[im 38/68  brain]
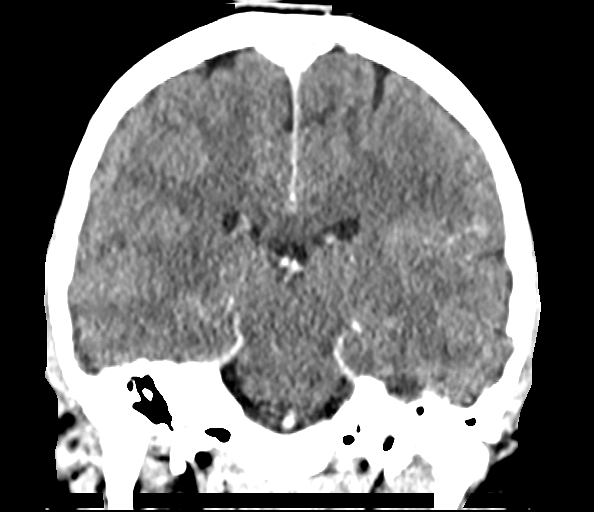

[14 of 47 positions shown; findings below may reference images not displayed]

FINDINGS: Brain: There is no evidence of acute infarct, intracranial
hemorrhage, mass, midline shift, or extra-axial fluid collection.
The ventricles and sulci are normal. No abnormal enhancement is
identified.

Vascular: Major dural venous sinuses are patent.

Skull: 1.6 cm focus of dense bone projecting from the outer table of
the right temporal bone just anterior to the lambdoid suture
consistent with a benign osteoma.

Sinuses/Orbits: Partially visualized posterior right ethmoid air
cell opacification. Clear mastoid air cells. Unremarkable included
orbits.

Other: None.
IMPRESSION: 1. 1.6 cm right skull osteoma.
2. Normal CT appearance of the brain.

## 2020-01-17 DIAGNOSIS — F330 Major depressive disorder, recurrent, mild: Secondary | ICD-10-CM | POA: Diagnosis not present

## 2020-01-17 DIAGNOSIS — F5101 Primary insomnia: Secondary | ICD-10-CM | POA: Diagnosis not present

## 2020-01-17 DIAGNOSIS — F411 Generalized anxiety disorder: Secondary | ICD-10-CM | POA: Diagnosis not present

## 2020-01-20 DIAGNOSIS — F330 Major depressive disorder, recurrent, mild: Secondary | ICD-10-CM | POA: Diagnosis not present

## 2020-01-20 DIAGNOSIS — F5105 Insomnia due to other mental disorder: Secondary | ICD-10-CM | POA: Diagnosis not present

## 2020-01-20 DIAGNOSIS — F411 Generalized anxiety disorder: Secondary | ICD-10-CM | POA: Diagnosis not present

## 2020-01-24 DIAGNOSIS — F411 Generalized anxiety disorder: Secondary | ICD-10-CM | POA: Diagnosis not present

## 2020-01-24 DIAGNOSIS — F330 Major depressive disorder, recurrent, mild: Secondary | ICD-10-CM | POA: Diagnosis not present

## 2020-01-24 DIAGNOSIS — F5101 Primary insomnia: Secondary | ICD-10-CM | POA: Diagnosis not present

## 2020-01-30 DIAGNOSIS — F411 Generalized anxiety disorder: Secondary | ICD-10-CM | POA: Diagnosis not present

## 2020-01-30 DIAGNOSIS — F330 Major depressive disorder, recurrent, mild: Secondary | ICD-10-CM | POA: Diagnosis not present

## 2020-01-30 DIAGNOSIS — F5101 Primary insomnia: Secondary | ICD-10-CM | POA: Diagnosis not present

## 2020-02-02 DIAGNOSIS — F5105 Insomnia due to other mental disorder: Secondary | ICD-10-CM | POA: Diagnosis not present

## 2020-02-02 DIAGNOSIS — Z79899 Other long term (current) drug therapy: Secondary | ICD-10-CM | POA: Diagnosis not present

## 2020-02-02 DIAGNOSIS — F332 Major depressive disorder, recurrent severe without psychotic features: Secondary | ICD-10-CM | POA: Diagnosis not present

## 2020-02-02 DIAGNOSIS — F411 Generalized anxiety disorder: Secondary | ICD-10-CM | POA: Diagnosis not present

## 2020-02-06 DIAGNOSIS — Z20828 Contact with and (suspected) exposure to other viral communicable diseases: Secondary | ICD-10-CM | POA: Diagnosis not present

## 2020-02-06 DIAGNOSIS — Z03818 Encounter for observation for suspected exposure to other biological agents ruled out: Secondary | ICD-10-CM | POA: Diagnosis not present

## 2020-02-07 DIAGNOSIS — F5101 Primary insomnia: Secondary | ICD-10-CM | POA: Diagnosis not present

## 2020-02-07 DIAGNOSIS — F411 Generalized anxiety disorder: Secondary | ICD-10-CM | POA: Diagnosis not present

## 2020-02-07 DIAGNOSIS — F330 Major depressive disorder, recurrent, mild: Secondary | ICD-10-CM | POA: Diagnosis not present

## 2020-02-12 DIAGNOSIS — F332 Major depressive disorder, recurrent severe without psychotic features: Secondary | ICD-10-CM | POA: Diagnosis not present

## 2020-02-12 DIAGNOSIS — F5105 Insomnia due to other mental disorder: Secondary | ICD-10-CM | POA: Diagnosis not present

## 2020-02-12 DIAGNOSIS — F411 Generalized anxiety disorder: Secondary | ICD-10-CM | POA: Diagnosis not present

## 2020-02-14 DIAGNOSIS — F5101 Primary insomnia: Secondary | ICD-10-CM | POA: Diagnosis not present

## 2020-02-14 DIAGNOSIS — F411 Generalized anxiety disorder: Secondary | ICD-10-CM | POA: Diagnosis not present

## 2020-02-14 DIAGNOSIS — F330 Major depressive disorder, recurrent, mild: Secondary | ICD-10-CM | POA: Diagnosis not present

## 2020-02-20 DIAGNOSIS — F330 Major depressive disorder, recurrent, mild: Secondary | ICD-10-CM | POA: Diagnosis not present

## 2020-02-20 DIAGNOSIS — F411 Generalized anxiety disorder: Secondary | ICD-10-CM | POA: Diagnosis not present

## 2020-02-20 DIAGNOSIS — F5101 Primary insomnia: Secondary | ICD-10-CM | POA: Diagnosis not present

## 2020-03-01 DIAGNOSIS — F330 Major depressive disorder, recurrent, mild: Secondary | ICD-10-CM | POA: Diagnosis not present

## 2020-03-01 DIAGNOSIS — F5101 Primary insomnia: Secondary | ICD-10-CM | POA: Diagnosis not present

## 2020-03-01 DIAGNOSIS — F411 Generalized anxiety disorder: Secondary | ICD-10-CM | POA: Diagnosis not present

## 2020-03-06 DIAGNOSIS — F330 Major depressive disorder, recurrent, mild: Secondary | ICD-10-CM | POA: Diagnosis not present

## 2020-03-06 DIAGNOSIS — F5101 Primary insomnia: Secondary | ICD-10-CM | POA: Diagnosis not present

## 2020-03-06 DIAGNOSIS — F411 Generalized anxiety disorder: Secondary | ICD-10-CM | POA: Diagnosis not present

## 2020-03-08 ENCOUNTER — Ambulatory Visit: Payer: Self-pay | Admitting: *Deleted

## 2020-03-08 ENCOUNTER — Encounter: Payer: Self-pay | Admitting: Physician Assistant

## 2020-03-08 ENCOUNTER — Telehealth (INDEPENDENT_AMBULATORY_CARE_PROVIDER_SITE_OTHER): Payer: Federal, State, Local not specified - PPO | Admitting: Physician Assistant

## 2020-03-08 DIAGNOSIS — R630 Anorexia: Secondary | ICD-10-CM

## 2020-03-08 DIAGNOSIS — R110 Nausea: Secondary | ICD-10-CM

## 2020-03-08 DIAGNOSIS — R251 Tremor, unspecified: Secondary | ICD-10-CM | POA: Diagnosis not present

## 2020-03-08 DIAGNOSIS — R634 Abnormal weight loss: Secondary | ICD-10-CM | POA: Diagnosis not present

## 2020-03-08 DIAGNOSIS — F4329 Adjustment disorder with other symptoms: Secondary | ICD-10-CM

## 2020-03-08 MED ORDER — ONDANSETRON HCL 4 MG PO TABS: 4.0000 mg | ORAL_TABLET | Freq: Three times a day (TID) | ORAL | 0 refills | Status: DC | PRN

## 2020-03-08 NOTE — Telephone Encounter (Signed)
FYI

## 2020-03-08 NOTE — Telephone Encounter (Signed)
Per initial encounter, "Patient had 2nd covid shot and is vomiting. Patient would like to speak with nurse"; contacted pt reagarding her symptoms; the pt states she had her 2nd COVID vaccine on 03/05/20(Pfizer) ; on 03/08/20 she had emesis x 1, and lower  back pain 0400; she is also having body aches which started on 03/05/30, and intermittent headache; the pt says she is also spotting that started 03/06/20; the pt states that she is supposed to start her period in 2 weeks, and she takes birth control; her LMP was 02/21/20;the pt says that she may have picked up a stomach bug; explained to pt that [place] like symptoms can occur after vaccination; recommendations made per nurse triage protocol; she verbalized understanding; algorithm completed; algorithm completed; pt offered and accepted virtual appt with Fenton Malling, [facility], 03/08/20 at 1500; will route to office for notification.  Reason for Disposition . MILD or MODERATE vomiting (e.g., 1 - 5 times / day)  Answer Assessment - Initial Assessment Questions 1. MAIN CONCERN OR SYMPTOM:  "What is your main concern right now?" "What question do you have?" "What's the main symptom you're worried about?" (e.g., fever, pain, redness, swelling)     vomiting 2. VACCINE: "What vaccination did you receive?" "Is this your first or second shot?" (e.g., none; Moderna, Pfizer, other)     2nd dose 03/05/20 Pfizer 3. SYMPTOM ONSET: "When did the begin?" (e.g., not relevant; hours, days)      03/05/20 4. SYMPTOM SEVERITY: "How bad is it?"      5. FEVER: "Is there a fever?" If so, ask: "What is it, how was it measured, and when did it start?"     no 6. PAST REACTIONS: "Have you reacted to immunizations before?" If so, ask: "What happened?"     no 7. OTHER SYMPTOMS: "Do you have any other symptoms?"    Low back pain, spotting, headache  Protocols used: VOMITING-A-AH, CORONAVIRUS (COVID-19) VACCINE QUESTIONS AND REACTIONS-A-AH

## 2020-03-08 NOTE — Patient Instructions (Signed)

## 2020-03-08 NOTE — Progress Notes (Signed)
Patient: Rhonda Cole Female    DOB: 1996/09/10   24 y.o.   MRN: [id number] Visit Date: 03/08/2020  Today's Provider: Mar Daring, PA-C   Chief Complaint  Patient presents with  . GI Problem   Subjective:    Virtual Visit via Video Note  I connected with Kc Warne on 03/08/20 at  3:00 PM EST by a video enabled telemedicine application and verified that I am speaking with the correct person using two identifiers.  Location: Patient: Home Provider: [facility]   I discussed the limitations of evaluation and management by telemedicine and the availability of in person appointments. The patient expressed understanding and agreed to proceed.   GI Problem The primary symptoms include fatigue, vomiting and myalgias. Episode onset: Monday. The problem has been gradually improving.  The illness is also significant for back pain.  Patient also C/O lower back pain. She had her second COVID vaccine on Monday 03/05/20. She has been around 2 sick contacts that had GI symptoms. Symptoms started Tuesday with generalized myalgias, low back pain and slight temperature (98.9 - high for her). Symptoms have been consistent until this morning. She awoke around 4am with abdominal cramping and vomiting. Mild nausea. Able to keep food and liquids down now. Feeling better just still mild back pain. Using heating pad.   Also reports having some tremors in her hands over the last couple months. She reports she is not concerned but her parents are. She also has lost 10 pounds without trying over the same time period. She feels it is due to situational stressors currently. Reports just not having an appetite.   Allergies  Allergen Reactions  . Augmentin [Amoxicillin-Pot Clavulanate] Rash     Current Outpatient Medications:  .  hydrOXYzine (ATARAX/VISTARIL) 25 MG tablet, take 1-2 tablets by mouth at bedtime if needed for anxiety, Disp: , Rfl: 0 .  norethindrone-ethinyl estradiol-iron (BLISOVI FE  1.5/30) 1.5-30 MG-MCG tablet, Take 1 tablet by mouth daily., Disp: 3 Package, Rfl: 3 .  sertraline (ZOLOFT) 100 MG tablet, 200 mg daily. , Disp: , Rfl: 1  Review of Systems  Constitutional: Positive for appetite change, fatigue and unexpected weight change.  Respiratory: Negative.   Cardiovascular: Negative.   Gastrointestinal: Positive for vomiting.  Musculoskeletal: Positive for back pain and myalgias.  Neurological: Positive for tremors.    Social History   Tobacco Use  . Smoking status: Never Smoker  . Smokeless tobacco: Never Used  Substance Use Topics  . Alcohol use: No      Objective:   There were no vitals taken for this visit. There were no vitals filed for this visit.There is no height or weight on file to calculate BMI.   Physical Exam Vitals reviewed.  Constitutional:      General: She is not in acute distress.    Appearance: Normal appearance. She is well-developed. She is not ill-appearing.  HENT:     Head: Normocephalic and atraumatic.  Pulmonary:     Effort: Pulmonary effort is normal. No respiratory distress.  Musculoskeletal:     Cervical back: Normal range of motion and neck supple.  Neurological:     Mental Status: She is alert.  Psychiatric:        Mood and Affect: Mood normal.        Behavior: Behavior normal.        Thought Content: Thought content normal.        Judgment: Judgment normal.  No results found for any visits on 03/08/20.     Assessment & Plan     1. Nausea Improving. Continue bland diet and staying hydrated. May use tylenol for myalgias. Continue heating pad. Call if symptoms worsen again.  - ondansetron (ZOFRAN) 4 MG tablet; Take 1 tablet (4 mg total) by mouth every 8 (eight) hours as needed.  Dispense: 20 tablet; Refill: 0 - CBC w/Diff/Platelet - Comprehensive Metabolic Panel (CMET) - T4 AND TSH - Vitamin D (25 hydroxy) - B12 and Folate Panel - Fe+TIBC+Fer  2. Weight loss Noted 10 pound weight loss over the  last 2-3 months with decreased appetite and having some tremors in the hands. Will check labs as below and f/u pending results.  - CBC w/Diff/Platelet - Comprehensive Metabolic Panel (CMET) - T4 AND TSH - Vitamin D (25 hydroxy) - B12 and Folate Panel - Fe+TIBC+Fer  3. Loss of appetite See above medical treatment plan. - CBC w/Diff/Platelet - Comprehensive Metabolic Panel (CMET) - T4 AND TSH - Vitamin D (25 hydroxy) - B12 and Folate Panel - Fe+TIBC+Fer  4. Tremor See above medical treatment plan. - CBC w/Diff/Platelet - Comprehensive Metabolic Panel (CMET) - T4 AND TSH - Vitamin D (25 hydroxy) - B12 and Folate Panel - Fe+TIBC+Fer  5. Stress and adjustment reaction Patient reports increased stressors but feels she is managing ok. Will check labs as below and f/u pending results.  - CBC w/Diff/Platelet - Comprehensive Metabolic Panel (CMET) - T4 AND TSH - Vitamin D (25 hydroxy) - B12 and Folate Panel - Fe+TIBC+Fer   I discussed the assessment and treatment plan with the patient. The patient was provided an opportunity to ask questions and all were answered. The patient agreed with the plan and demonstrated an understanding of the instructions.   The patient was advised to call back or seek an in-person evaluation if the symptoms worsen or if the condition fails to improve as anticipated.  I provided 12 minutes of non-face-to-face time during this encounter.     Mar Daring, PA-C  [facility]

## 2020-03-19 DIAGNOSIS — F411 Generalized anxiety disorder: Secondary | ICD-10-CM | POA: Diagnosis not present

## 2020-03-19 DIAGNOSIS — F5105 Insomnia due to other mental disorder: Secondary | ICD-10-CM | POA: Diagnosis not present

## 2020-03-19 DIAGNOSIS — F332 Major depressive disorder, recurrent severe without psychotic features: Secondary | ICD-10-CM | POA: Diagnosis not present

## 2020-03-22 DIAGNOSIS — F330 Major depressive disorder, recurrent, mild: Secondary | ICD-10-CM | POA: Diagnosis not present

## 2020-03-22 DIAGNOSIS — F5101 Primary insomnia: Secondary | ICD-10-CM | POA: Diagnosis not present

## 2020-03-22 DIAGNOSIS — F411 Generalized anxiety disorder: Secondary | ICD-10-CM | POA: Diagnosis not present

## 2020-03-28 DIAGNOSIS — F5101 Primary insomnia: Secondary | ICD-10-CM | POA: Diagnosis not present

## 2020-03-28 DIAGNOSIS — F330 Major depressive disorder, recurrent, mild: Secondary | ICD-10-CM | POA: Diagnosis not present

## 2020-03-28 DIAGNOSIS — F411 Generalized anxiety disorder: Secondary | ICD-10-CM | POA: Diagnosis not present

## 2020-04-05 DIAGNOSIS — F330 Major depressive disorder, recurrent, mild: Secondary | ICD-10-CM | POA: Diagnosis not present

## 2020-04-05 DIAGNOSIS — F5101 Primary insomnia: Secondary | ICD-10-CM | POA: Diagnosis not present

## 2020-04-05 DIAGNOSIS — F411 Generalized anxiety disorder: Secondary | ICD-10-CM | POA: Diagnosis not present

## 2020-04-12 DIAGNOSIS — F5101 Primary insomnia: Secondary | ICD-10-CM | POA: Diagnosis not present

## 2020-04-12 DIAGNOSIS — F411 Generalized anxiety disorder: Secondary | ICD-10-CM | POA: Diagnosis not present

## 2020-04-12 DIAGNOSIS — F330 Major depressive disorder, recurrent, mild: Secondary | ICD-10-CM | POA: Diagnosis not present

## 2020-04-19 DIAGNOSIS — F330 Major depressive disorder, recurrent, mild: Secondary | ICD-10-CM | POA: Diagnosis not present

## 2020-04-19 DIAGNOSIS — F411 Generalized anxiety disorder: Secondary | ICD-10-CM | POA: Diagnosis not present

## 2020-04-19 DIAGNOSIS — F5101 Primary insomnia: Secondary | ICD-10-CM | POA: Diagnosis not present

## 2020-04-23 DIAGNOSIS — F5101 Primary insomnia: Secondary | ICD-10-CM | POA: Diagnosis not present

## 2020-04-23 DIAGNOSIS — F330 Major depressive disorder, recurrent, mild: Secondary | ICD-10-CM | POA: Diagnosis not present

## 2020-04-23 DIAGNOSIS — F411 Generalized anxiety disorder: Secondary | ICD-10-CM | POA: Diagnosis not present

## 2020-05-07 DIAGNOSIS — F411 Generalized anxiety disorder: Secondary | ICD-10-CM | POA: Diagnosis not present

## 2020-05-07 DIAGNOSIS — F5101 Primary insomnia: Secondary | ICD-10-CM | POA: Diagnosis not present

## 2020-05-07 DIAGNOSIS — F330 Major depressive disorder, recurrent, mild: Secondary | ICD-10-CM | POA: Diagnosis not present

## 2020-05-10 ENCOUNTER — Telehealth: Payer: Self-pay

## 2020-05-10 NOTE — Telephone Encounter (Signed)
Copied from [place] [id number]. Topic: Appointment Scheduling - Transfer of Care >> May 10, 2020 11:06 AM Celene Kras wrote: Pt is requesting to transfer FROM: Fenton Malling  Pt is requesting to transfer TO: Lavon Paganini  Reason for requested transfer: Pts mother states that the other women in the family see Dr. B and is requesting to have the same for pt. Please advise.   Send CRM to patient's current PCP (transferring FROM).

## 2020-05-10 NOTE — Telephone Encounter (Signed)
Agree transfer is fine

## 2020-05-10 NOTE — Telephone Encounter (Signed)
Fine by me. Can see when next CPE or f/u is due and schedule with me if she wants

## 2020-05-10 NOTE — Telephone Encounter (Signed)
LMTCB 05/10/2020.  PEC please advise below.   Thanks,   -Mickel Baas

## 2020-05-11 NOTE — Telephone Encounter (Signed)
Attempted to call pt.  Left vm to call office re: transfer of care/ appt.

## 2020-05-14 DIAGNOSIS — F5101 Primary insomnia: Secondary | ICD-10-CM | POA: Diagnosis not present

## 2020-05-14 DIAGNOSIS — F330 Major depressive disorder, recurrent, mild: Secondary | ICD-10-CM | POA: Diagnosis not present

## 2020-05-14 DIAGNOSIS — F411 Generalized anxiety disorder: Secondary | ICD-10-CM | POA: Diagnosis not present

## 2020-05-17 DIAGNOSIS — F411 Generalized anxiety disorder: Secondary | ICD-10-CM | POA: Diagnosis not present

## 2020-05-17 DIAGNOSIS — F332 Major depressive disorder, recurrent severe without psychotic features: Secondary | ICD-10-CM | POA: Diagnosis not present

## 2020-05-17 DIAGNOSIS — F5105 Insomnia due to other mental disorder: Secondary | ICD-10-CM | POA: Diagnosis not present

## 2020-05-21 ENCOUNTER — Ambulatory Visit (INDEPENDENT_AMBULATORY_CARE_PROVIDER_SITE_OTHER): Payer: Federal, State, Local not specified - PPO | Admitting: Physician Assistant

## 2020-05-21 ENCOUNTER — Other Ambulatory Visit: Payer: Self-pay

## 2020-05-21 ENCOUNTER — Encounter: Payer: Self-pay | Admitting: Physician Assistant

## 2020-05-21 VITALS — BP 126/82 | HR 113 | Temp 97.0°F | Resp 16 | Wt 117.0 lb

## 2020-05-21 DIAGNOSIS — R634 Abnormal weight loss: Secondary | ICD-10-CM | POA: Diagnosis not present

## 2020-05-21 DIAGNOSIS — F419 Anxiety disorder, unspecified: Secondary | ICD-10-CM | POA: Diagnosis not present

## 2020-05-21 DIAGNOSIS — R251 Tremor, unspecified: Secondary | ICD-10-CM | POA: Diagnosis not present

## 2020-05-21 NOTE — Patient Instructions (Addendum)
Essential Tremor A tremor is trembling or shaking that a person cannot control. Most tremors affect the hands or arms. Tremors can also affect the head, vocal cords, legs, and other parts of the body. Essential tremor is a tremor without a known cause. Usually, it occurs while a person is trying to perform an action. It tends to get worse gradually as a person ages. What are the causes? The cause of this condition is not known. What increases the risk? You are more likely to develop this condition if:  You have a family member with essential tremor.  You are age 40 or older.  You take certain medicines. What are the signs or symptoms? The main sign of a tremor is a rhythmic shaking of certain parts of your body that is uncontrolled and unintentional. You may:  Have difficulty eating with a spoon or fork.  Have difficulty writing.  Nod your head up and down or side to side.  Have a quivering voice. The shaking may:  Get worse over time.  Come and go.  Be more noticeable on one side of your body.  Get worse due to stress, fatigue, caffeine, and extreme heat or cold. How is this diagnosed? This condition may be diagnosed based on:  Your symptoms and medical history.  A physical exam. There is no single test to diagnose an essential tremor. However, your health care provider may order tests to rule out other causes of your condition. These may include:  Blood and urine tests.  Imaging studies of your brain, such as CT scan and MRI.  A test that measures involuntary muscle movement (electromyogram). How is this treated? Treatment for essential tremor depends on the severity of the condition.  Some tremors may go away without treatment.  Mild tremors may not need treatment if they do not affect your day-to-day life.  Severe tremors may need to be treated using one or more of the following options: ? Medicines. ? Lifestyle changes. ? Occupational or physical  therapy. Follow these instructions at home: Lifestyle   Do not use any products that contain nicotine or tobacco, such as cigarettes and e-cigarettes. If you need help quitting, ask your health care provider.  Limit your caffeine intake as told by your health care provider.  Try to get 8 hours of sleep each night.  Find ways to manage your stress that fits your lifestyle and personality. Consider trying meditation or yoga.  Try to anticipate stressful situations and allow extra time to manage them.  If you are struggling emotionally with the effects of your tremor, consider working with a mental health provider. General instructions  Take over-the-counter and prescription medicines only as told by your health care provider.  Avoid extreme heat and extreme cold.  Keep all follow-up visits as told by your health care provider. This is important. Visits may include physical therapy visits. Contact a health care provider if:  You experience any changes in the location or intensity of your tremors.  You start having a tremor after starting a new medicine.  You have tremor with other symptoms, such as: ? Numbness. ? Tingling. ? Pain. ? Weakness.  Your tremor gets worse.  Your tremor interferes with your daily life.  You feel down, blue, or sad for at least 2 weeks in a row.  Worrying about your tremor and what other people think about you interferes with your everyday life functions, including relationships, work, or school. Summary  Essential tremor is a tremor   without a known cause. Usually, it occurs when you are trying to perform an action.  The cause of this condition is not known.  The main sign of a tremor is a rhythmic shaking of certain parts of your body that is uncontrolled and unintentional.  Treatment for essential tremor depends on the severity of the condition. This information is not intended to replace advice given to you by your health care provider.  Make sure you discuss any questions you have with your health care provider. Document Revised: 12/25/2017 Document Reviewed: 12/25/2017 Elsevier Patient Education  [place].   High-Protein and High-Calorie Diet Eating high-protein and high-calorie foods can help you to gain weight, heal after an injury, and recover after an illness or surgery. The specific amount of daily protein and calories you need depends on:  Your body weight.  The reason this diet is recommended for you. What is my plan? Generally, a high-protein, high-calorie diet involves:  Eating 250-500 extra calories each day.  Making sure that you get enough of your daily calories from protein. Ask your health care provider how many of your calories should come from protein. Talk with a health care provider, such as a diet and nutrition specialist (dietitian), about how much protein and how many calories you need each day. Follow the diet as directed by your health care provider. What are tips for following this plan?  Preparing meals  Add whole milk, half-and-half, or heavy cream to cereal, pudding, soup, or hot cocoa.  Add whole milk to instant breakfast drinks.  Add peanut butter to oatmeal or smoothies.  Add powdered milk to baked goods, smoothies, or milkshakes.  Add powdered milk, cream, or butter to mashed potatoes.  Add cheese to cooked vegetables.  Make whole-milk yogurt parfaits. Top them with granola, fruit, or nuts.  Add cottage cheese to your fruit.  Add avocado, cheese, or both to sandwiches or salads.  Add meat, poultry, or seafood to rice, pasta, casseroles, salads, and soups.  Use mayonnaise when making egg salad, chicken salad, or tuna salad.  Use peanut butter as a dip for vegetables or as a topping for pretzels, celery, or crackers.  Add beans to casseroles, dips, and spreads.  Add pureed beans to sauces and soups.  Replace calorie-free drinks with calorie-containing drinks,  such as milk and fruit juice.  Replace water with milk or heavy cream when making foods such as oatmeal, pudding, or cocoa. General instructions  Ask your health care provider if you should take a nutritional supplement.  Try to eat six small meals each day instead of three large meals.  Eat a balanced diet. In each meal, include one food that is high in protein.  Keep nutritious snacks available, such as nuts, trail mixes, dried fruit, and yogurt.  If you have kidney disease or diabetes, talk with your health care provider about how much protein is safe for you. Too much protein may put extra stress on your kidneys.  Drink your calories. Choose high-calorie drinks and have them after your meals. What high-protein foods should I eat?  Vegetables Soybeans. Peas. Grains Quinoa. Bulgur wheat. Meats and other proteins Beef, pork, and poultry. Fish and seafood. Eggs. Tofu. Textured vegetable protein (TVP). Peanut butter. Nuts and seeds. Dried beans. Protein powders. Dairy Whole milk. Whole-milk yogurt. Powdered milk. Cheese. [employer]. Eggnog. Beverages High-protein supplement drinks. Soy milk. Other foods Protein bars. The items listed above may not be a complete list of high-protein foods and beverages.  Contact a dietitian for more options. What high-calorie foods should I eat? Fruits Dried fruit. Fruit leather. Canned fruit in syrup. Fruit juice. Avocado. Vegetables Vegetables cooked in oil or butter. Fried potatoes. Grains Pasta. Quick breads. Muffins. Pancakes. Ready-to-eat cereal. Meats and other proteins Peanut butter. Nuts and seeds. Dairy Heavy cream. Whipped cream. Cream cheese. Sour cream. Ice cream. Custard. Pudding. Beverages Meal-replacement beverages. Nutrition shakes. Fruit juice. Sugar-sweetened soft drinks. Seasonings and condiments Salad dressing. Mayonnaise. Alfredo sauce. Fruit preserves or jelly. Honey. Syrup. Sweets and desserts Cake. Cookies.  Pie. Pastries. Candy bars. Chocolate. Fats and oils Butter or margarine. Oil. Gravy. Other foods Meal-replacement bars. The items listed above may not be a complete list of high-calorie foods and beverages. Contact a dietitian for more options. Summary  A high-protein, high-calorie diet can help you gain weight or heal faster after an injury, illness, or surgery.  To increase your protein and calories, add ingredients such as whole milk, peanut butter, cheese, beans, meat, or seafood to meal items.  To get enough extra calories each day, include high-calorie foods and beverages at each meal.  Adding a high-calorie drink or shake can be an easy way to help you get enough calories each day. Talk with your healthcare provider or dietitian about the best options for you. This information is not intended to replace advice given to you by your health care provider. Make sure you discuss any questions you have with your health care provider. Document Revised: 11/27/2017 Document Reviewed: 10/27/2017 Elsevier Patient Education  2020 [employer].

## 2020-05-21 NOTE — Progress Notes (Signed)
Established patient visit   Patient: Rhonda Cole   DOB: 02/04/1996   24 y.o. Female  MRN: [id number] Visit Date: 05/21/2020  Today's healthcare provider: Mar Daring, PA-C   Chief Complaint  Patient presents with  . Tremors   Subjective    HPI Patient coming in with c/o tremors. She was seen via my chart on 03/08/2020 and mentioned this was occurring some. She states she is not concerned and feels it is her anxiety and stress. She reports her mother is the one more concerned and the reason she is even here today.  She is also on Effexor since February 2021. Reports that the tremors have worsened since last year, but she is having more stress as well.   Tremors do not occur at rest, or bother her at rest. Notices mostly extremity tremor (hands and legs mostly). Hands shake with intention or when lifting something. Legs and feet can tremor some. Noticed her foot shaking some when pressing the gas pedal in her car.   Reports that she has been losing weight. She is not trying doesn't know if this is related to stress. Has lost about 10 pounds she feels. Was able to gain weight, close to 140 pounds, when she was placed on Remeron last year. She is followed by Dr. Nicolasa Ducking, psychiatry.   Wt Readings from Last 3 Encounters:  05/21/20 117 lb (53.1 kg)  09/13/19 135 lb (61.2 kg)  04/21/19 118 lb (53.5 kg)    Patient Active Problem List   Diagnosis Date Noted  . 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 Anxiety 08/04/2014   Past Medical History:  Diagnosis Date  . Anxiety   . Mild depression ([place])   . Osteoma of skull   . Vaccine for human papilloma virus (HPV) types 6, 11, 16, and 18 administered        Medications: Outpatient Medications Prior to Visit  Medication Sig  . hydrOXYzine (ATARAX/VISTARIL) 25 MG tablet take 1-2 tablets by mouth at bedtime if needed for anxiety  . norethindrone-ethinyl estradiol-iron (BLISOVI FE 1.5/30) 1.5-30 MG-MCG tablet Take 1  tablet by mouth daily.  . ondansetron (ZOFRAN) 4 MG tablet Take 1 tablet (4 mg total) by mouth every 8 (eight) hours as needed.  . sertraline (ZOLOFT) 100 MG tablet 200 mg daily.   Marland Kitchen venlafaxine XR (EFFEXOR-XR) 75 MG 24 hr capsule Take 75 mg by mouth daily.   No facility-administered medications prior to visit.    Review of Systems  Constitutional: Positive for appetite change and unexpected weight change.  Respiratory: Negative.   Cardiovascular: Negative.   Gastrointestinal: Negative.   Neurological: Positive for tremors.  Psychiatric/Behavioral: The patient is nervous/anxious.        Objective    BP 126/82 (BP Location: Left Arm, Patient Position: Sitting, Cuff Size: Large)   Pulse (!) 113   Temp (!) 97 F (36.1 C) (Temporal)   Resp 16   Wt 117 lb (53.1 kg)   BMI 19.17 kg/m  BP Readings from Last 3 Encounters:  05/21/20 126/82  09/13/19 100/70  04/21/19 110/74   Wt Readings from Last 3 Encounters:  05/21/20 117 lb (53.1 kg)  09/13/19 135 lb (61.2 kg)  04/21/19 118 lb (53.5 kg)      Physical Exam Vitals reviewed.  Constitutional:      General: She is not in acute distress.    Appearance: Normal appearance. She is well-developed and normal weight. She is not ill-appearing or diaphoretic.  Neck:     Thyroid: No thyromegaly.     Vascular: No JVD.     Trachea: No tracheal deviation.  Cardiovascular:     Rate and Rhythm: Normal rate and regular rhythm.     Pulses: Normal pulses.     Heart sounds: Normal heart sounds. No murmur. No friction rub. No gallop.   Pulmonary:     Effort: Pulmonary effort is normal. No respiratory distress.     Breath sounds: Normal breath sounds. No wheezing or rales.  Musculoskeletal:     Cervical back: Normal range of motion and neck supple.  Lymphadenopathy:     Cervical: No cervical adenopathy.  Skin:    General: Skin is warm.     Comments: Open sores around fingertips from picking  Neurological:     General: No focal deficit  present.     Mental Status: She is alert and oriented to person, place, and time. Mental status is at baseline.     Cranial Nerves: No cranial nerve deficit.     Sensory: No sensory deficit.     Motor: No weakness.     Coordination: Coordination normal.     Gait: Gait normal.  Psychiatric:        Attention and Perception: Attention and perception normal.        Mood and Affect: Affect normal. Mood is anxious.        Speech: Speech normal.        Behavior: Behavior normal. Behavior is cooperative.        Thought Content: Thought content normal. Thought content does not include homicidal or suicidal ideation.        Cognition and Memory: Cognition and memory normal.        Judgment: Judgment normal.       No results found for any visits on 05/21/20.  Assessment & Plan     1. Anxiety Followed by psychiatry but expect contributing to symptoms of lack of appetite and tremor.Information provided on AVS for how to add calorie rich foods into diet to help with weight gain. Also discussed adding protein shakes such as boost or ensure between meals, not replacing meals. Will get labs that were ordered at last visit to confirm no other organic cause contributing. Will f/u pending labs.   2. Weight loss See above medical treatment plan.  3. Tremor Suspect worsening anxiety vs essential tremor. Will refer to Neurology for reassurance of benign tremor. Consult appreciated.  - Ambulatory referral to Neurology   Return if symptoms worsen or fail to improve.      Reynolds Bowl, PA-C, have reviewed all documentation for this visit. The documentation on 05/22/20 for the exam, diagnosis, procedures, and orders are all accurate and complete.   Rubye Beach  [facility] [phone] (phone) [phone] (fax)  [place]

## 2020-05-22 DIAGNOSIS — F5101 Primary insomnia: Secondary | ICD-10-CM | POA: Diagnosis not present

## 2020-05-22 DIAGNOSIS — F330 Major depressive disorder, recurrent, mild: Secondary | ICD-10-CM | POA: Diagnosis not present

## 2020-05-22 DIAGNOSIS — F411 Generalized anxiety disorder: Secondary | ICD-10-CM | POA: Diagnosis not present

## 2020-05-23 DIAGNOSIS — R251 Tremor, unspecified: Secondary | ICD-10-CM | POA: Diagnosis not present

## 2020-05-23 DIAGNOSIS — R110 Nausea: Secondary | ICD-10-CM | POA: Diagnosis not present

## 2020-05-23 DIAGNOSIS — R634 Abnormal weight loss: Secondary | ICD-10-CM | POA: Diagnosis not present

## 2020-05-23 DIAGNOSIS — R630 Anorexia: Secondary | ICD-10-CM | POA: Diagnosis not present

## 2020-05-24 ENCOUNTER — Telehealth: Payer: Self-pay

## 2020-05-24 LAB — CBC WITH DIFFERENTIAL/PLATELET
Basophils Absolute: 0 10*3/uL (ref 0.0–0.2)
Basos: 0 %
EOS (ABSOLUTE): 0 10*3/uL (ref 0.0–0.4)
Eos: 0 %
Hematocrit: 44.1 % (ref 34.0–46.6)
Hemoglobin: 14.7 g/dL (ref 11.1–15.9)
Immature Grans (Abs): 0 10*3/uL (ref 0.0–0.1)
Immature Granulocytes: 0 %
Lymphocytes Absolute: 2.5 10*3/uL (ref 0.7–3.1)
Lymphs: 33 %
MCH: 29.3 pg (ref 26.6–33.0)
MCHC: 33.3 g/dL (ref 31.5–35.7)
MCV: 88 fL (ref 79–97)
Monocytes Absolute: 0.7 10*3/uL (ref 0.1–0.9)
Monocytes: 9 %
Neutrophils Absolute: 4.3 10*3/uL (ref 1.4–7.0)
Neutrophils: 58 %
Platelets: 223 10*3/uL (ref 150–450)
RBC: 5.02 x10E6/uL (ref 3.77–5.28)
RDW: 13.1 % (ref 11.7–15.4)
WBC: 7.5 10*3/uL (ref 3.4–10.8)

## 2020-05-24 LAB — COMPREHENSIVE METABOLIC PANEL
ALT: 10 IU/L (ref 0–32)
AST: 15 IU/L (ref 0–40)
Albumin/Globulin Ratio: 1.9 (ref 1.2–2.2)
Albumin: 5 g/dL (ref 3.9–5.0)
Alkaline Phosphatase: 82 IU/L (ref 48–121)
BUN/Creatinine Ratio: 10 (ref 9–23)
BUN: 9 mg/dL (ref 6–20)
Bilirubin Total: 0.3 mg/dL (ref 0.0–1.2)
CO2: 18 mmol/L — ABNORMAL LOW (ref 20–29)
Calcium: 9.9 mg/dL (ref 8.7–10.2)
Chloride: 103 mmol/L (ref 96–106)
Creatinine, Ser: 0.89 mg/dL (ref 0.57–1.00)
GFR calc Af Amer: 107 mL/min/{1.73_m2} (ref >59)
GFR calc non Af Amer: 93 mL/min/{1.73_m2} (ref >59)
Globulin, Total: 2.6 g/dL (ref 1.5–4.5)
Glucose: 107 mg/dL — ABNORMAL HIGH (ref 65–99)
Potassium: 3.4 mmol/L — ABNORMAL LOW (ref 3.5–5.2)
Sodium: 139 mmol/L (ref 134–144)
Total Protein: 7.6 g/dL (ref 6.0–8.5)

## 2020-05-24 LAB — IRON,TIBC AND FERRITIN PANEL
Ferritin: 191 ng/mL — ABNORMAL HIGH (ref 15–150)
Iron Saturation: 54 % (ref 15–55)
Iron: 165 ug/dL — ABNORMAL HIGH (ref 27–159)
Total Iron Binding Capacity: 305 ug/dL (ref 250–450)
UIBC: 140 ug/dL (ref 131–425)

## 2020-05-24 LAB — VITAMIN D 25 HYDROXY (VIT D DEFICIENCY, FRACTURES): Vit D, 25-Hydroxy: 47.2 ng/mL (ref 30.0–100.0)

## 2020-05-24 LAB — T4 AND TSH
T4, Total: 6.6 ug/dL (ref 4.5–12.0)
TSH: 3.78 u[IU]/mL (ref 0.450–4.500)

## 2020-05-24 LAB — B12 AND FOLATE PANEL
Folate: 10.5 ng/mL (ref >3.0)
Vitamin B-12: 469 pg/mL (ref 232–1245)

## 2020-05-24 NOTE — Telephone Encounter (Signed)
[employer]   Result Notes and Comments to Patient Comment seen by patient [name] on 05/24/2020 11:48 AM EDT

## 2020-05-24 NOTE — Telephone Encounter (Signed)
-----   Message from Mar Daring, [place] sent at 05/24/2020  9:28 AM EDT ----- Blood count is normal. Kidney and liver function are normal. Potassium is slightly low at 3.4. Low normal is 3.5. Can increase by increasing potassium-rich foods, such as sweet potato, broccoli, leafy greens and bananas. Thyroid is normal. Vit D is normal. B12 and folate. Iron is normal.

## 2020-05-31 DIAGNOSIS — F411 Generalized anxiety disorder: Secondary | ICD-10-CM | POA: Diagnosis not present

## 2020-05-31 DIAGNOSIS — F5101 Primary insomnia: Secondary | ICD-10-CM | POA: Diagnosis not present

## 2020-05-31 DIAGNOSIS — F330 Major depressive disorder, recurrent, mild: Secondary | ICD-10-CM | POA: Diagnosis not present

## 2020-06-11 DIAGNOSIS — F411 Generalized anxiety disorder: Secondary | ICD-10-CM | POA: Diagnosis not present

## 2020-06-11 DIAGNOSIS — F5101 Primary insomnia: Secondary | ICD-10-CM | POA: Diagnosis not present

## 2020-06-11 DIAGNOSIS — F330 Major depressive disorder, recurrent, mild: Secondary | ICD-10-CM | POA: Diagnosis not present

## 2020-06-25 DIAGNOSIS — F330 Major depressive disorder, recurrent, mild: Secondary | ICD-10-CM | POA: Diagnosis not present

## 2020-06-25 DIAGNOSIS — F411 Generalized anxiety disorder: Secondary | ICD-10-CM | POA: Diagnosis not present

## 2020-06-25 DIAGNOSIS — F5101 Primary insomnia: Secondary | ICD-10-CM | POA: Diagnosis not present

## 2020-06-29 IMAGING — CT CT HEAD WITHOUT CONTRAST
3 of 4 series · 15 of 47 positions shown, 18 images · non-contrast
Comparison: 09/20/2018.

CLINICAL DATA: Continued surveillance of osteoma.

EXAM:
CT HEAD WITHOUT CONTRAST
TECHNIQUE: Contiguous axial images were obtained from the base of the skull
through the vertex without intravenous contrast.

[Series 2: head 2.00 hr40 s3 ibhc · axial · 0.42mm/px · z∈[-584,-456]mm · 9 of 76 slices shown, 12 images]
[im 6/76  brain]
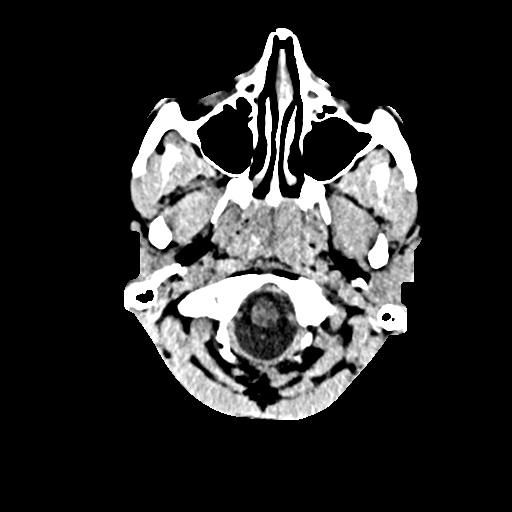
[im 6/76  bone]
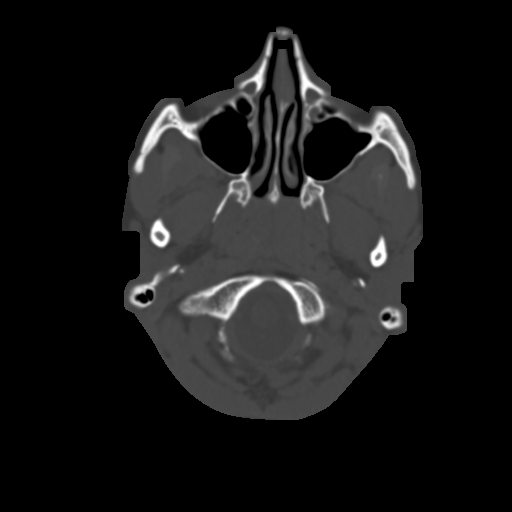
[im 17/76  brain]
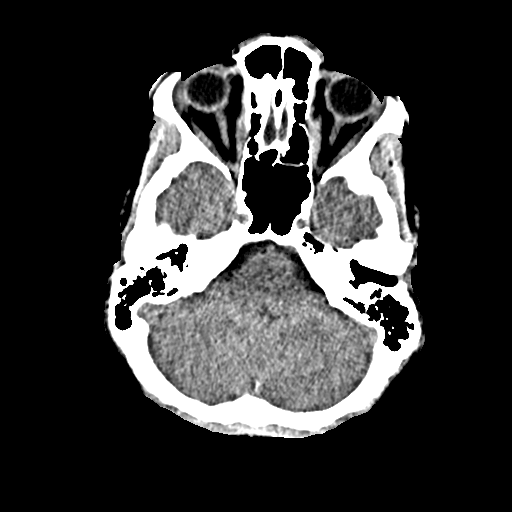
[im 22/76  brain]
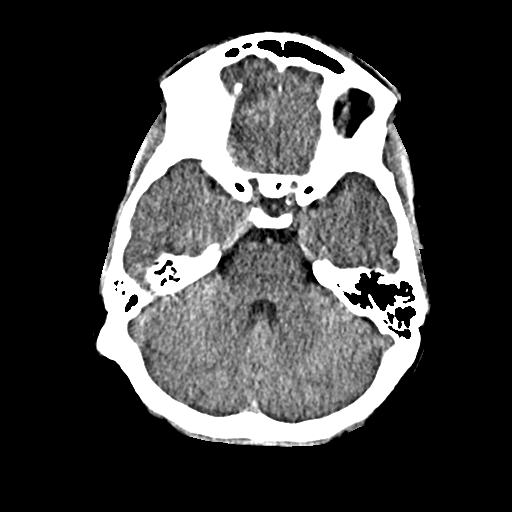
[im 33/76  brain]
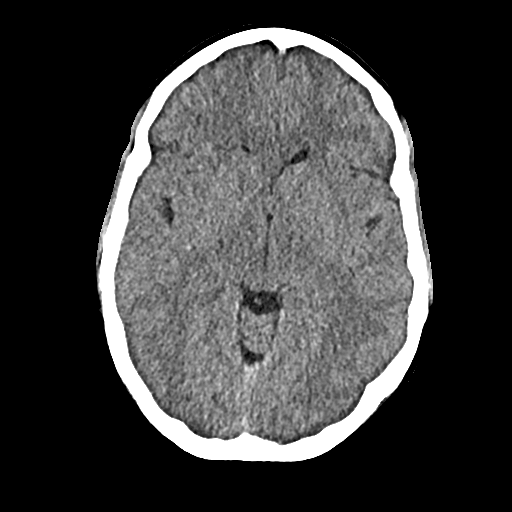
[im 38/76  brain]
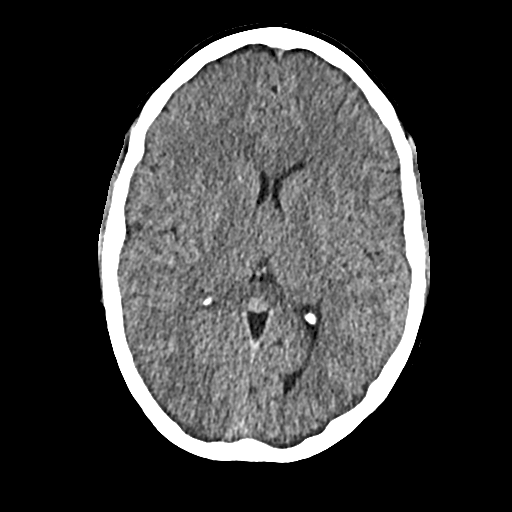
[im 38/76  bone]
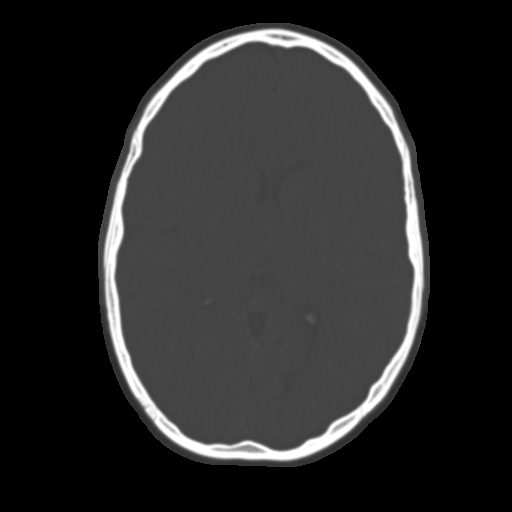
[im 43/76  brain]
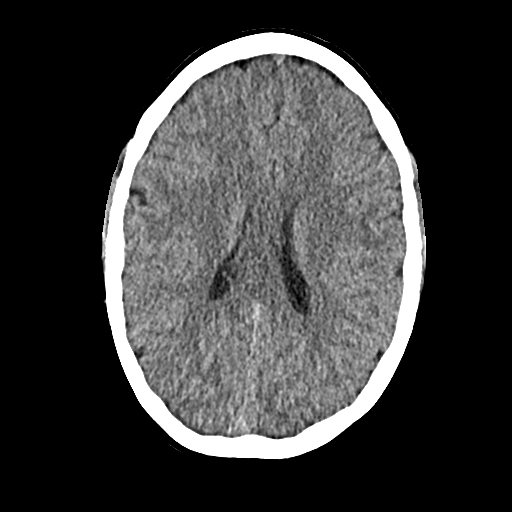
[im 54/76  brain]
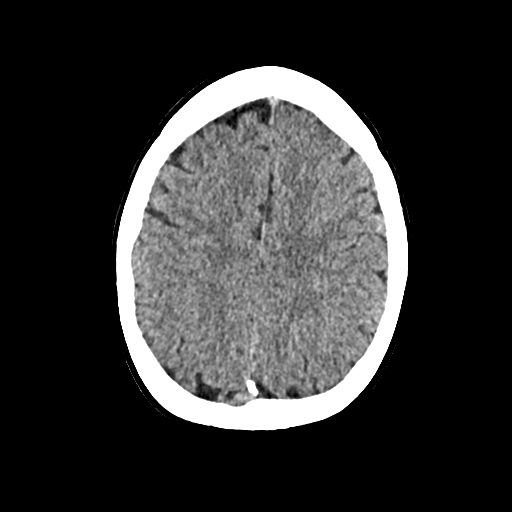
[im 59/76  brain]
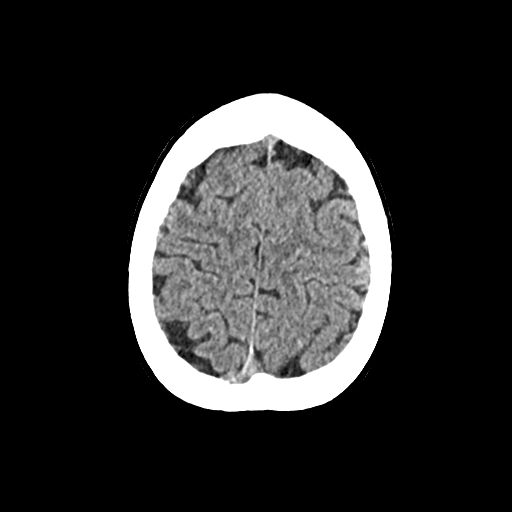
[im 70/76  brain]
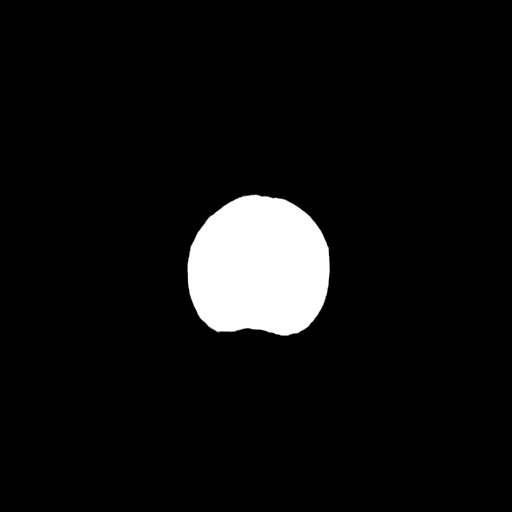
[im 70/76  bone]
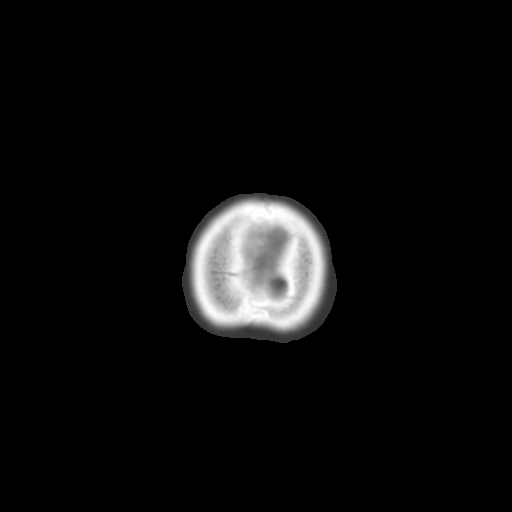

[Series 4: head 3.00 hr40 s3 sag · sagittal · 0.31mm/px · 3 of 56 slices shown]
[im 19/56  brain]
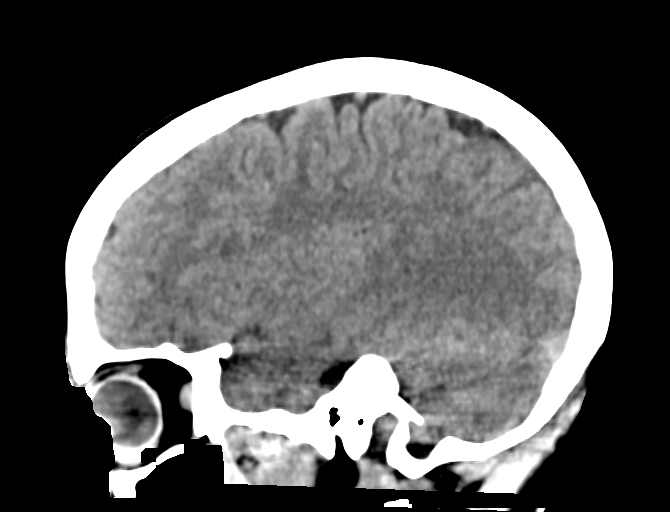
[im 28/56  brain]
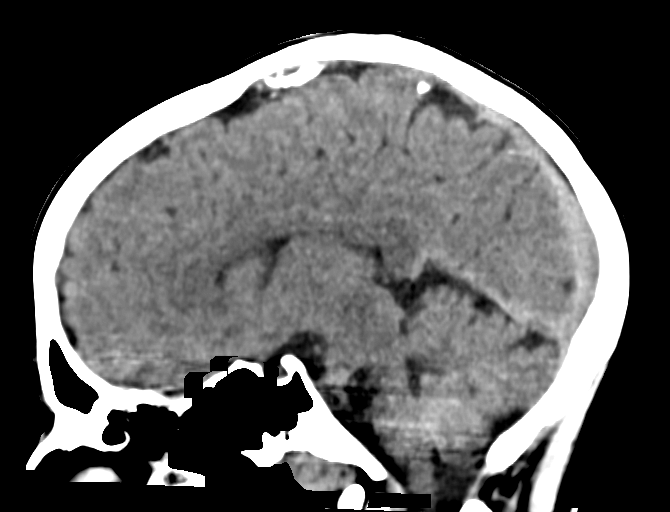
[im 37/56  brain]
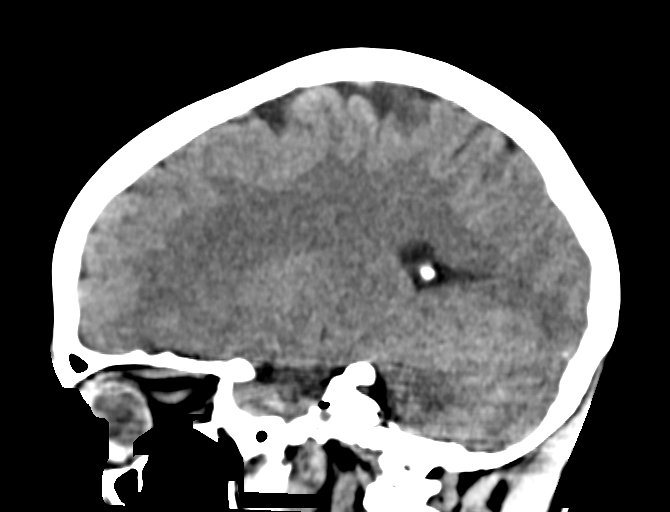

[Series 6: head 3.00 hr40 s3 cor · coronal · 0.30mm/px · 3 of 69 slices shown]
[im 23/69  brain]
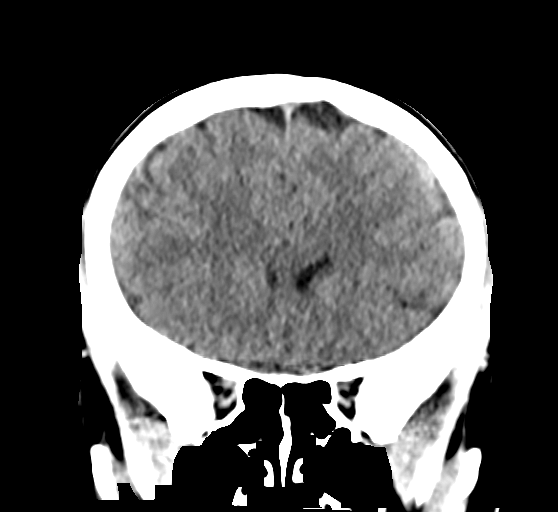
[im 31/69  brain]
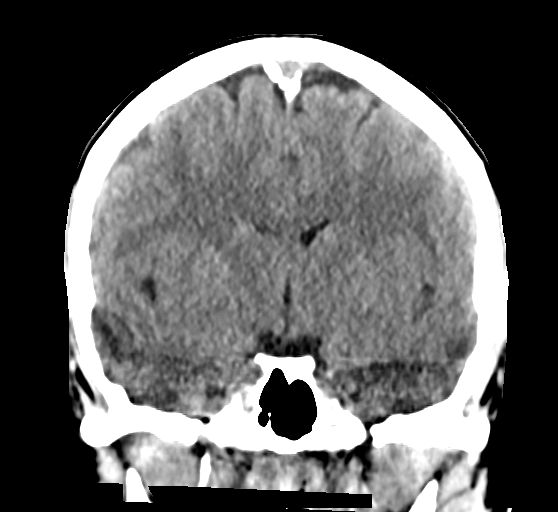
[im 38/69  brain]
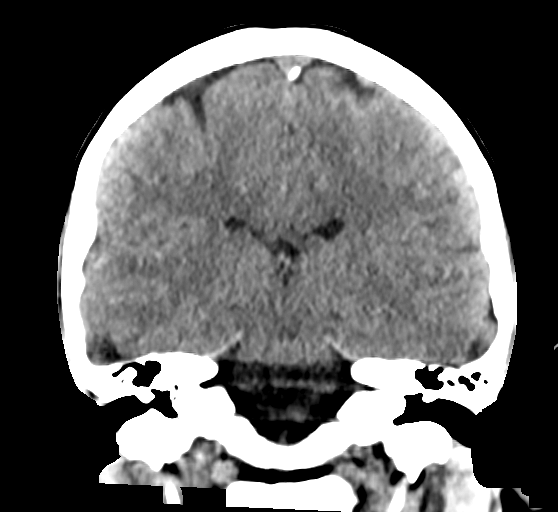

[15 of 47 positions shown; findings below may reference images not displayed]

FINDINGS: Brain: No evidence for acute infarction, hemorrhage, mass lesion,
hydrocephalus, or extra-axial fluid. Normal cerebral volume. No
white matter disease.

Vascular: No hyperdense vessel or unexpected calcification.

Skull: Redemonstrated is an osteoma projecting from the outer table
of the RIGHT temporal bone. This is just anterior to the lambdoid
suture. Thickness measures 13 mm, unchanged. The lesion is more
difficult to accurately measure in the AP dimension, but also
appears unchanged, approximately 16-17 mm. There are no aggressive
features to the osteoma, and no obvious features to suggest interval
growth.

Sinuses/Orbits: No acute finding.

Other: The osteoma does not involve the mastoid air cells. There is
no layering fluid. Scalp soft tissues unremarkable.
IMPRESSION: Unchanged RIGHT temporal bone osteoma. No intracranial sequelae. No
aggressive features.

## 2020-07-05 DIAGNOSIS — F330 Major depressive disorder, recurrent, mild: Secondary | ICD-10-CM | POA: Diagnosis not present

## 2020-07-05 DIAGNOSIS — F411 Generalized anxiety disorder: Secondary | ICD-10-CM | POA: Diagnosis not present

## 2020-07-05 DIAGNOSIS — F5101 Primary insomnia: Secondary | ICD-10-CM | POA: Diagnosis not present

## 2020-07-11 DIAGNOSIS — F5101 Primary insomnia: Secondary | ICD-10-CM | POA: Diagnosis not present

## 2020-07-11 DIAGNOSIS — F330 Major depressive disorder, recurrent, mild: Secondary | ICD-10-CM | POA: Diagnosis not present

## 2020-07-11 DIAGNOSIS — F411 Generalized anxiety disorder: Secondary | ICD-10-CM | POA: Diagnosis not present

## 2020-07-16 DIAGNOSIS — Z20822 Contact with and (suspected) exposure to covid-19: Secondary | ICD-10-CM | POA: Diagnosis not present

## 2020-07-16 DIAGNOSIS — Z03818 Encounter for observation for suspected exposure to other biological agents ruled out: Secondary | ICD-10-CM | POA: Diagnosis not present

## 2020-07-23 DIAGNOSIS — F330 Major depressive disorder, recurrent, mild: Secondary | ICD-10-CM | POA: Diagnosis not present

## 2020-07-23 DIAGNOSIS — F411 Generalized anxiety disorder: Secondary | ICD-10-CM | POA: Diagnosis not present

## 2020-07-23 DIAGNOSIS — F5101 Primary insomnia: Secondary | ICD-10-CM | POA: Diagnosis not present

## 2020-07-31 DIAGNOSIS — F411 Generalized anxiety disorder: Secondary | ICD-10-CM | POA: Diagnosis not present

## 2020-07-31 DIAGNOSIS — F5101 Primary insomnia: Secondary | ICD-10-CM | POA: Diagnosis not present

## 2020-07-31 DIAGNOSIS — F330 Major depressive disorder, recurrent, mild: Secondary | ICD-10-CM | POA: Diagnosis not present

## 2020-08-08 DIAGNOSIS — F411 Generalized anxiety disorder: Secondary | ICD-10-CM | POA: Diagnosis not present

## 2020-08-08 DIAGNOSIS — F5101 Primary insomnia: Secondary | ICD-10-CM | POA: Diagnosis not present

## 2020-08-08 DIAGNOSIS — F330 Major depressive disorder, recurrent, mild: Secondary | ICD-10-CM | POA: Diagnosis not present

## 2020-08-09 ENCOUNTER — Encounter: Payer: Self-pay | Admitting: Family Medicine

## 2020-08-09 ENCOUNTER — Other Ambulatory Visit: Payer: Self-pay

## 2020-08-09 ENCOUNTER — Ambulatory Visit (INDEPENDENT_AMBULATORY_CARE_PROVIDER_SITE_OTHER): Payer: Federal, State, Local not specified - PPO | Admitting: Family Medicine

## 2020-08-09 VITALS — BP 110/84 | HR 99 | Temp 98.8°F | Resp 16 | Ht 67.0 in | Wt 115.8 lb

## 2020-08-09 DIAGNOSIS — Z0000 Encounter for general adult medical examination without abnormal findings: Secondary | ICD-10-CM

## 2020-08-09 DIAGNOSIS — Z111 Encounter for screening for respiratory tuberculosis: Secondary | ICD-10-CM | POA: Diagnosis not present

## 2020-08-09 NOTE — Progress Notes (Signed)
Complete physical exam   Patient: Rhonda Cole   DOB: Sep 02, 1996   24 y.o. Female  MRN: [id number] Visit Date: 08/09/2020  Today's healthcare provider: Lavon Paganini, MD  Cameron Ali as a scribe for Lavon Paganini, MD.,have documented all relevant documentation on the behalf of Lavon Paganini, MD,as directed by  Lavon Paganini, MD while in the presence of Lavon Paganini, MD. Chief Complaint  Patient presents with  . Annual Exam   Subjective      HPI  Rhonda Cole is a 24 y.o. female who presents today for a complete physical exam.  She reports consuming a general diet. Patient reports that she stays active walking. She generally feels well. She reports sleeping well. She does not have additional problems to discuss today.  Last Reported Pap 09/13/2019, Normal  Past Medical History:  Diagnosis Date  . Anxiety   . Mild depression ([place])   . Osteoma of skull   . Vaccine for human papilloma virus (HPV) types 6, 11, 16, and 18 administered    Past Surgical History:  Procedure Laterality Date  . HERNIA REPAIR  1999   double when she was a baby  . INGUINAL HERNIA REPAIR Bilateral 2001  . TYMPANOSTOMY TUBE PLACEMENT  2000   x2  . WISDOM TOOTH EXTRACTION  2018   Social History   Socioeconomic History  . Marital status: Single    Spouse name: Not on file  . Number of children: Not on file  . Years of education: Not on file  . Highest education level: Not on file  Occupational History  . Not on file  Tobacco Use  . Smoking status: Never Smoker  . Smokeless tobacco: Never Used  Vaping Use  . Vaping Use: Never used  Substance and Sexual Activity  . Alcohol use: No  . Drug use: No  . Sexual activity: Yes    Birth control/protection: Pill  Other Topics Concern  . Not on file  Social History Narrative  . Not on file   Social Determinants of Health   Financial Resource Strain:   . Difficulty of Paying Living Expenses:   Food  Insecurity:   . Worried About Charity fundraiser in the Last Year:   . Arboriculturist in the Last Year:   Transportation Needs:   . Film/video editor (Medical):   Marland Kitchen Lack of Transportation (Non-Medical):   Physical Activity:   . Days of Exercise per Week:   . Minutes of Exercise per Session:   Stress:   . Feeling of Stress :   Social Connections:   . Frequency of Communication with Friends and Family:   . Frequency of Social Gatherings with Friends and Family:   . Attends Religious Services:   . Active Member of Clubs or Organizations:   . Attends Archivist Meetings:   Marland Kitchen Marital Status:   Intimate Partner Violence:   . Fear of Current or Ex-Partner:   . Emotionally Abused:   Marland Kitchen Physically Abused:   . Sexually Abused:    Family Status  Relation Name Status  . Mother  Alive  . Father  Alive  . Sister  Alive  . Sister  Alive  . Sister  Alive  . MGM  (Not Specified)  . PGF  (Not Specified)  . Other MGA Alive   Family History  Problem Relation Age of Onset  . Migraines Mother   . Hyperlipidemia Father   . Hypertension Father   .  Arthritis Father   . Asthma Sister   . Alzheimer's disease Maternal Grandmother   . Arthritis Maternal Grandmother   . Hyperlipidemia Paternal Grandfather   . Hypertension Paternal Grandfather   . Cancer Paternal Grandfather        COLON; LUNG; LYMPHOMA; ?  . Heart failure Paternal Grandfather   . Heart attack Paternal Grandfather   . Cancer Other        BREAST   Allergies  Allergen Reactions  . Augmentin [Amoxicillin-Pot Clavulanate] Rash    Patient Care Team: Virginia Crews, MD as PCP - General (Family Medicine)   Medications: Outpatient Medications Prior to Visit  Medication Sig  . hydrOXYzine (ATARAX/VISTARIL) 25 MG tablet take 1-2 tablets by mouth at bedtime if needed for anxiety  . norethindrone-ethinyl estradiol-iron (BLISOVI FE 1.5/30) 1.5-30 MG-MCG tablet Take 1 tablet by mouth daily.  . ondansetron  (ZOFRAN) 4 MG tablet Take 1 tablet (4 mg total) by mouth every 8 (eight) hours as needed.  . sertraline (ZOLOFT) 100 MG tablet 200 mg daily.   Marland Kitchen venlafaxine XR (EFFEXOR-XR) 75 MG 24 hr capsule Take 75 mg by mouth daily.   No facility-administered medications prior to visit.    Review of Systems  Constitutional: Positive for appetite change.  Neurological: Positive for tremors.  All other systems reviewed and are negative.     Objective    BP 110/84   Pulse 99   Temp 98.8 F (37.1 C) (Oral)   Resp 16   Ht 5' 7" (1.702 m)   Wt 115 lb 12.8 oz (52.5 kg)   LMP 08/06/2020   SpO2 98%   BMI 18.14 kg/m    Physical Exam Vitals reviewed.  Constitutional:      Appearance: Normal appearance. She is normal weight.  HENT:     Head: Normocephalic and atraumatic.     Right Ear: External ear normal.     Left Ear: External ear normal.  Eyes:     Extraocular Movements: Extraocular movements intact.     Conjunctiva/sclera: Conjunctivae normal.  Cardiovascular:     Rate and Rhythm: Normal rate and regular rhythm.     Pulses: Normal pulses.     Heart sounds: Normal heart sounds.  Pulmonary:     Effort: Pulmonary effort is normal. No respiratory distress.     Breath sounds: Normal breath sounds. No stridor. No wheezing, rhonchi or rales.  Chest:     Chest wall: No tenderness.     Breasts: Tanner Score is 5.        Right: Normal. No inverted nipple, nipple discharge, skin change or tenderness.        Left: Normal. No inverted nipple, nipple discharge, skin change or tenderness.  Abdominal:     General: There is no distension.     Palpations: Abdomen is soft.     Tenderness: There is no abdominal tenderness.  Genitourinary:    Vagina: Normal.     Cervix: Normal.     Uterus: Normal.      Adnexa: Right adnexa normal.     Rectum: No external hemorrhoid.  Musculoskeletal:        General: Normal range of motion.     Cervical back: Normal range of motion and neck supple.    Lymphadenopathy:     Upper Body:     Right upper body: No supraclavicular, axillary or pectoral adenopathy.     Left upper body: No supraclavicular, axillary or pectoral adenopathy.  Skin:  General: Skin is dry.  Neurological:     Mental Status: She is alert and oriented to person, place, and time.     Motor: No weakness.     Gait: Gait normal.  Psychiatric:        Attention and Perception: Attention normal.        Mood and Affect: Mood normal.        Speech: Speech normal.        Behavior: Behavior normal. Behavior is cooperative.        Thought Content: Thought content normal.        Cognition and Memory: Cognition normal.        Judgment: Judgment normal.     Last depression screening scores PHQ 2/9 Scores 08/09/2020 04/21/2019 09/25/2017  PHQ - 2 Score 0 4 3  PHQ- 9 Score _0 Last fall risk screening Fall Risk  08/09/2020  Falls in the past year? 0  Number falls in past yr: 0  Injury with Fall? 0   Last Audit-C alcohol use screening Alcohol Use Disorder Test (AUDIT) 08/09/2020  1. How often do you have a drink containing alcohol? 1  2. How many drinks containing alcohol do you have on a typical day when you are drinking? 0  3. How often do you have six or more drinks on one occasion? 0  AUDIT-C Score 1   A score of 3 or more in women, and 4 or more in men indicates increased risk for alcohol abuse, EXCEPT if all of the points are from question 1   No results found for any visits on 08/09/20.  Assessment & Plan    Routine Health Maintenance and Physical Exam  Exercise Activities and Dietary recommendations Goals   None     Immunization History  Administered Date(s) Administered  . DTaP 09/11/1998, 11/07/1998, 01/09/1999, 01/10/2000, 08/01/2003  . Hepatitis A 07/10/2009, 02/06/2011  . Hepatitis B 09/11/1998, 11/07/1998, 04/18/1999  . HiB (PRP-OMP) 09/11/1998, 11/07/1998, 01/09/1999, 01/10/2000  . IPV 09/11/1998, 11/07/1998, 01/09/1999, 08/01/2003  .  Influenza-Unspecified 12/01/2017  . MMR 07/09/1999, 08/01/2003  . Meningococcal Conjugate 02/06/2011, 06/13/2015  . PFIZER SARS-COV-2 Vaccination 02/09/2020, 03/05/2020  . Pneumococcal-Unspecified 10/16/1999, 01/10/2000  . Td 07/10/2009  . Tdap 07/10/2009, 12/01/2017  . Varicella 07/09/1999, 07/10/2009    Health Maintenance  Topic Date Due  . Hepatitis C Screening  Never done  . HIV Screening  Never done  . INFLUENZA VACCINE  07/29/2020  . PAP-Cervical Cytology Screening  09/12/2022  . PAP SMEAR-Modifier  09/12/2022  . TETANUS/TDAP  12/02/2027  . COVID-19 Vaccine  Completed    Discussed health benefits of physical activity, and encouraged her to engage in regular exercise appropriate for her age and condition.  Problem List Items Addressed This Visit    None    Visit Diagnoses    Encounter for annual physical exam    -  Primary   Tuberculosis screening       Relevant Orders   QuantiFERON-TB Gold Plus       Return in about 1 year (around 08/09/2021) for CPE.     I, Lavon Paganini, MD, have reviewed all documentation for this visit. The documentation on 08/10/20 for the exam, diagnosis, procedures, and orders are all accurate and complete.   Kadyn Chovan, Dionne Bucy, MD, MPH [facility]

## 2020-08-09 NOTE — Patient Instructions (Signed)
Health Maintenance, Female Adopting a healthy lifestyle and getting preventive care are important in promoting health and wellness. Ask your health care provider about:  The right schedule for you to have regular tests and exams.  Things you can do on your own to prevent diseases and keep yourself healthy. What should I know about diet, weight, and exercise? Eat a healthy diet   Eat a diet that includes plenty of vegetables, fruits, low-fat dairy products, and lean protein.  Do not eat a lot of foods that are high in solid fats, added sugars, or sodium. Maintain a healthy weight Body mass index (BMI) is used to identify weight problems. It estimates body fat based on height and weight. Your health care provider can help determine your BMI and help you achieve or maintain a healthy weight. Get regular exercise Get regular exercise. This is one of the most important things you can do for your health. Most adults should:  Exercise for at least 150 minutes each week. The exercise should increase your heart rate and make you sweat (moderate-intensity exercise).  Do strengthening exercises at least twice a week. This is in addition to the moderate-intensity exercise.  Spend less time sitting. Even light physical activity can be beneficial. Watch cholesterol and blood lipids Have your blood tested for lipids and cholesterol at 22 years of age, then have this test every 5 years. Have your cholesterol levels checked more often if:  Your lipid or cholesterol levels are high.  You are older than 22 years of age.  You are at high risk for heart disease. What should I know about cancer screening? Depending on your health history and family history, you may need to have cancer screening at various ages. This may include screening for:  Breast cancer.  Cervical cancer.  Colorectal cancer.  Skin cancer.  Lung cancer. What should I know about heart disease, diabetes, and high blood  pressure? Blood pressure and heart disease  High blood pressure causes heart disease and increases the risk of stroke. This is more likely to develop in people who have high blood pressure readings, are of African descent, or are overweight.  Have your blood pressure checked: ? Every 3-5 years if you are 18-39 years of age. ? Every year if you are 40 years old or older. Diabetes Have regular diabetes screenings. This checks your fasting blood sugar level. Have the screening done:  Once every three years after age 40 if you are at a normal weight and have a low risk for diabetes.  More often and at a younger age if you are overweight or have a high risk for diabetes. What should I know about preventing infection? Hepatitis B If you have a higher risk for hepatitis B, you should be screened for this virus. Talk with your health care provider to find out if you are at risk for hepatitis B infection. Hepatitis C Testing is recommended for:  Everyone born from 1945 through 1965.  Anyone with known risk factors for hepatitis C. Sexually transmitted infections (STIs)  Get screened for STIs, including gonorrhea and chlamydia, if: ? You are sexually active and are younger than 22 years of age. ? You are older than 22 years of age and your health care provider tells you that you are at risk for this type of infection. ? Your sexual activity has changed since you were last screened, and you are at increased risk for chlamydia or gonorrhea. Ask your health care provider if   you are at risk.  Ask your health care provider about whether you are at high risk for HIV. Your health care provider may recommend a prescription medicine to help prevent HIV infection. If you choose to take medicine to prevent HIV, you should first get tested for HIV. You should then be tested every 3 months for as long as you are taking the medicine. Pregnancy  If you are about to stop having your period (premenopausal) and  you may become pregnant, seek counseling before you get pregnant.  Take 400 to 800 micrograms (mcg) of folic acid every day if you become pregnant.  Ask for birth control (contraception) if you want to prevent pregnancy. Osteoporosis and menopause Osteoporosis is a disease in which the bones lose minerals and strength with aging. This can result in bone fractures. If you are 85 years old or older, or if you are at risk for osteoporosis and fractures, ask your health care provider if you should:  Be screened for bone loss.  Take a calcium or vitamin D supplement to lower your risk of fractures.  Be given hormone replacement therapy (HRT) to treat symptoms of menopause. Follow these instructions at home: Lifestyle  Do not use any products that contain nicotine or tobacco, such as cigarettes, e-cigarettes, and chewing tobacco. If you need help quitting, ask your health care provider.  Do not use street drugs.  Do not share needles.  Ask your health care provider for help if you need support or information about quitting drugs. Alcohol use  Do not drink alcohol if: ? Your health care provider tells you not to drink. ? You are pregnant, may be pregnant, or are planning to become pregnant.  If you drink alcohol: ? Limit how much you use to 0-1 drink a day. ? Limit intake if you are breastfeeding.  Be aware of how much alcohol is in your drink. In the [place] one drink equals one 12 oz bottle of beer (355 mL), one 5 oz glass of wine (148 mL), or one 1 oz glass of hard liquor (44 mL). General instructions  Schedule regular health, dental, and eye exams.  Stay current with your vaccines.  Tell your health care provider if: ? You often feel depressed. ? You have ever been abused or do not feel safe at home. Summary  Adopting a healthy lifestyle and getting preventive care are important in promoting health and wellness.  Follow your health care provider's instructions about healthy  diet, exercising, and getting tested or screened for diseases.  Follow your health care provider's instructions on monitoring your cholesterol and blood pressure. This information is not intended to replace advice given to you by your health care provider. Make sure you discuss any questions you have with your health care provider. Document Revised: 12/08/2018 Document Reviewed: 12/08/2018 Elsevier Patient Education  2020 [place] Breast self-awareness is knowing how your breasts look and feel. Doing breast self-awareness is important. It allows you to catch a breast problem early while it is still small and can be treated. All women should do breast self-awareness, including women who have had breast implants. Tell your doctor if you notice a change in your breasts. What you need:  A mirror.  A well-lit room. How to do a breast self-exam A breast self-exam is one way to learn what is normal for your breasts and to check for changes. To do a breast self-exam: Look for changes  1. Take off all  the clothes above your waist. 2. Stand in front of a mirror in a room with good lighting. 3. Put your hands on your hips. 4. Push your hands down. 5. Look at your breasts and nipples in the mirror to see if one breast or nipple looks different from the other. Check to see if: ? The shape of one breast is different. ? The size of one breast is different. ? There are wrinkles, dips, and bumps in one breast and not the other. 6. Look at each breast for changes in the skin, such as: ? Redness. ? Scaly areas. 7. Look for changes in your nipples, such as: ? Liquid around the nipples. ? Bleeding. ? Dimpling. ? Redness. ? A change in where the nipples are. Feel for changes  1. Lie on your back on the floor. 2. Feel each breast. To do this, follow these steps: ? Pick a breast to feel. ? Put the arm closest to that breast above your head. ? Use your other arm to feel  the nipple area of your breast. Feel the area with the pads of your three middle fingers by making small circles with your fingers. For the first circle, press lightly. For the second circle, press harder. For the third circle, press even harder. ? Keep making circles with your fingers at the different pressures as you move down your breast. Stop when you feel your ribs. ? Move your fingers a little toward the center of your body. ? Start making circles with your fingers again, this time going up until you reach your collarbone. ? Keep making up-and-down circles until you reach your armpit. Remember to keep using the three pressures. ? Feel the other breast in the same way. 3. Sit or stand in the tub or shower. 4. With soapy water on your skin, feel each breast the same way you did in step 2 when you were lying on the floor. Write down what you find Writing down what you find can help you remember what to tell your doctor. Write down:  What is normal for each breast.  Any changes you find in each breast, including: ? The kind of changes you find. ? Whether you have pain. ? Size and location of any lumps.  When you last had your menstrual period. General tips  Check your breasts every month.  If you are breastfeeding, the best time to check your breasts is after you feed your baby or after you use a breast pump.  If you get menstrual periods, the best time to check your breasts is 5-7 days after your menstrual period is over.  With time, you will become comfortable with the self-exam, and you will begin to know if there are changes in your breasts. Contact a doctor if you:  See a change in the shape or size of your breasts or nipples.  See a change in the skin of your breast or nipples, such as red or scaly skin.  Have fluid coming from your nipples that is not normal.  Find a lump or thick area that was not there before.  Have pain in your breasts.  Have any concerns about  your breast health. Summary  Breast self-awareness includes looking for changes in your breasts, as well as feeling for changes within your breasts.  Breast self-awareness should be done in front of a mirror in a well-lit room.  You should check your breasts every month. If you get menstrual periods, the best  time to check your breasts is 5-7 days after your menstrual period is over.  Let your doctor know of any changes you see in your breasts, including changes in size, changes on the skin, pain or tenderness, or fluid from your nipples that is not normal. This information is not intended to replace advice given to you by your health care provider. Make sure you discuss any questions you have with your health care provider. Document Revised: 08/03/2018 Document Reviewed: 08/03/2018 Elsevier Patient Education  [place].

## 2020-08-13 ENCOUNTER — Telehealth: Payer: Self-pay

## 2020-08-13 NOTE — Telephone Encounter (Signed)
-----   Message from Virginia Crews, MD sent at 08/13/2020 10:34 AM EDT ----- TB test is pending.  Can we see if patient needs work note in the meantime until it results?

## 2020-08-13 NOTE — Telephone Encounter (Signed)
PATIENT REPORTS SHE WILL WAIT ON TEST RESULTS.

## 2020-08-14 ENCOUNTER — Ambulatory Visit: Payer: Self-pay

## 2020-08-14 LAB — QUANTIFERON-TB GOLD PLUS
QuantiFERON Mitogen Value: >10 IU/mL
QuantiFERON Nil Value: 0.02 IU/mL
QuantiFERON TB1 Ag Value: 0.03 IU/mL
QuantiFERON TB2 Ag Value: 0.01 IU/mL
QuantiFERON-TB Gold Plus: NEGATIVE

## 2020-08-15 ENCOUNTER — Telehealth: Payer: Self-pay

## 2020-08-15 DIAGNOSIS — F411 Generalized anxiety disorder: Secondary | ICD-10-CM | POA: Diagnosis not present

## 2020-08-15 DIAGNOSIS — F332 Major depressive disorder, recurrent severe without psychotic features: Secondary | ICD-10-CM | POA: Diagnosis not present

## 2020-08-15 DIAGNOSIS — F5105 Insomnia due to other mental disorder: Secondary | ICD-10-CM | POA: Diagnosis not present

## 2020-08-15 NOTE — Telephone Encounter (Signed)
Patient called, left VM to return the call to the office for lab results.  ?

## 2020-08-15 NOTE — Telephone Encounter (Signed)
-----   Message from Virginia Crews, MD sent at 08/14/2020  8:14 AM EDT ----- TB test negative.  She can pick up form or we can fax it if she wants.

## 2020-08-15 NOTE — Telephone Encounter (Signed)
LMTCB 08/15/2020.  PEC please advise pt of lab results.   Thanks,   -Mickel Baas

## 2020-08-15 NOTE — Telephone Encounter (Signed)
Patient called and was read result by Dr Brita Romp written 08/15/20.  Patient states that she has already picked up the form.

## 2020-08-16 ENCOUNTER — Ambulatory Visit: Payer: Federal, State, Local not specified - PPO | Admitting: Physician Assistant

## 2020-08-17 ENCOUNTER — Ambulatory Visit: Payer: Federal, State, Local not specified - PPO | Admitting: Physician Assistant

## 2020-08-30 ENCOUNTER — Other Ambulatory Visit: Payer: Self-pay | Admitting: Obstetrics and Gynecology

## 2020-08-30 DIAGNOSIS — Z3041 Encounter for surveillance of contraceptive pills: Secondary | ICD-10-CM

## 2020-08-30 DIAGNOSIS — N946 Dysmenorrhea, unspecified: Secondary | ICD-10-CM

## 2020-09-20 ENCOUNTER — Other Ambulatory Visit: Payer: Self-pay

## 2020-09-20 ENCOUNTER — Encounter: Payer: Self-pay | Admitting: Obstetrics and Gynecology

## 2020-09-20 ENCOUNTER — Ambulatory Visit (INDEPENDENT_AMBULATORY_CARE_PROVIDER_SITE_OTHER): Payer: Federal, State, Local not specified - PPO | Admitting: Obstetrics and Gynecology

## 2020-09-20 ENCOUNTER — Other Ambulatory Visit (HOSPITAL_COMMUNITY)
Admission: RE | Admit: 2020-09-20 | Discharge: 2020-09-20 | Disposition: A | Discharge status: Home / Self Care | Payer: Federal, State, Local not specified - PPO | Source: Ambulatory Visit | Attending: Obstetrics and Gynecology | Admitting: Obstetrics and Gynecology

## 2020-09-20 VITALS — BP 112/70 | HR 80 | Resp 16 | Ht 67.0 in | Wt 124.4 lb

## 2020-09-20 DIAGNOSIS — F330 Major depressive disorder, recurrent, mild: Secondary | ICD-10-CM | POA: Diagnosis not present

## 2020-09-20 DIAGNOSIS — Z3041 Encounter for surveillance of contraceptive pills: Secondary | ICD-10-CM

## 2020-09-20 DIAGNOSIS — Z113 Encounter for screening for infections with a predominantly sexual mode of transmission: Secondary | ICD-10-CM | POA: Diagnosis not present

## 2020-09-20 DIAGNOSIS — N946 Dysmenorrhea, unspecified: Secondary | ICD-10-CM

## 2020-09-20 DIAGNOSIS — F411 Generalized anxiety disorder: Secondary | ICD-10-CM | POA: Diagnosis not present

## 2020-09-20 DIAGNOSIS — F5101 Primary insomnia: Secondary | ICD-10-CM | POA: Diagnosis not present

## 2020-09-20 DIAGNOSIS — Z01419 Encounter for gynecological examination (general) (routine) without abnormal findings: Secondary | ICD-10-CM

## 2020-09-20 MED ORDER — NORETHIN ACE-ETH ESTRAD-FE 1.5-30 MG-MCG PO TABS: 1.0000 | ORAL_TABLET | Freq: Every day | ORAL | 3 refills | Status: DC

## 2020-09-20 NOTE — Patient Instructions (Signed)
I value your feedback and entrusting us with your care. If you get a [facility] patient survey, I would appreciate you taking the time to let us know about your experience today. Thank you!  As of December 08, 2019, your lab results will be released to your MyChart immediately, before I even have a chance to see them. Please give me time to review them and contact you if there are any abnormalities. Thank you for your patience.  

## 2020-09-20 NOTE — Progress Notes (Signed)
Chief Complaint  Patient presents with  . Gynecologic Exam    annual exam    HPI:      [name] is a 24 y.o. G0P0000 who LMP was Patient's last menstrual period was 08/26/2020., presents today for her annual examination.  Her menses are regular every 28-30 days, lasting 4 days.  Dysmenorrhea mild, occurring first 1-2 days of flow. Cramps significantly improved with OCPs. She does not have intermenstrual bleeding.  Sex activity: single partner currently--contraception: OCPs. Last pap: 09/13/19 Results were normal No hx of STDs.  There is a FH of breast cancer in her mat grt aunt, genetic testing not indicated. There is no FH of ovarian cancer. The patient does do self-breast exams.  Tobacco use: The patient denies current or previous tobacco use. Alcohol use: none  No drug use Exercise: moderately active  She does get adequate calcium but not Vitamin D in her diet.  She has completed [place] vaccine series.    Past Medical History:  Diagnosis Date  . Anxiety   . Mild depression ([place])   . Osteoma of skull   . Vaccine for human papilloma virus (HPV) types 6, 11, 16, and 18 administered     Past Surgical History:  Procedure Laterality Date  . HERNIA REPAIR  1999   double when she was a baby  . INGUINAL HERNIA REPAIR Bilateral 2001  . TYMPANOSTOMY TUBE PLACEMENT  2000   x2  . WISDOM TOOTH EXTRACTION  2018    Family History  Problem Relation Age of Onset  . Migraines Mother   . Hyperlipidemia Father   . Hypertension Father   . Arthritis Father   . Asthma Sister   . Alzheimer's disease Maternal Grandmother   . Arthritis Maternal Grandmother   . Hyperlipidemia Paternal Grandfather   . Hypertension Paternal Grandfather   . Cancer Paternal Grandfather        COLON; LUNG; LYMPHOMA; ?  . Heart failure Paternal Grandfather   . Heart attack Paternal Grandfather   . Cancer Other        BREAST    Social History   Socioeconomic History  . Marital status:  Single    Spouse name: Not on file  . Number of children: Not on file  . Years of education: Not on file  . Highest education level: Not on file  Occupational History  . Not on file  Tobacco Use  . Smoking status: Never Smoker  . Smokeless tobacco: Never Used  Vaping Use  . Vaping Use: Never used  Substance and Sexual Activity  . Alcohol use: No  . Drug use: No  . Sexual activity: Yes    Birth control/protection: Pill  Other Topics Concern  . Not on file  Social History Narrative  . Not on file   Social Determinants of Health   Financial Resource Strain:   . Difficulty of Paying Living Expenses: Not on file  Food Insecurity:   . Worried About Charity fundraiser in the Last Year: Not on file  . Ran Out of Food in the Last Year: Not on file  Transportation Needs:   . Lack of Transportation (Medical): Not on file  . Lack of Transportation (Non-Medical): Not on file  Physical Activity:   . Days of Exercise per Week: Not on file  . Minutes of Exercise per Session: Not on file  Stress:   . Feeling of Stress : Not on file  Social Connections:   .  Frequency of Communication with Friends and Family: Not on file  . Frequency of Social Gatherings with Friends and Family: Not on file  . Attends Religious Services: Not on file  . Active Member of Clubs or Organizations: Not on file  . Attends Archivist Meetings: Not on file  . Marital Status: Not on file  Intimate Partner Violence:   . Fear of Current or Ex-Partner: Not on file  . Emotionally Abused: Not on file  . Physically Abused: Not on file  . Sexually Abused: Not on file     Current Outpatient Medications:  .  hydrOXYzine (ATARAX/VISTARIL) 25 MG tablet, take 1-2 tablets by mouth at bedtime if needed for anxiety, Disp: , Rfl: 0 .  norethindrone-ethinyl estradiol-iron (BLISOVI FE 1.5/30) 1.5-30 MG-MCG tablet, Take 1 tablet by mouth daily., Disp: 84 tablet, Rfl: 3 .  ondansetron (ZOFRAN) 4 MG tablet, Take 1  tablet (4 mg total) by mouth every 8 (eight) hours as needed., Disp: 20 tablet, Rfl: 0 .  sertraline (ZOLOFT) 100 MG tablet, 200 mg daily. , Disp: , Rfl: 1 .  venlafaxine XR (EFFEXOR-XR) 75 MG 24 hr capsule, Take 75 mg by mouth daily., Disp: , Rfl:   ROS:  Review of Systems  Constitutional: Negative for fatigue, fever and unexpected weight change.  Respiratory: Negative for cough, shortness of breath and wheezing.   Cardiovascular: Negative for chest pain, palpitations and leg swelling.  Gastrointestinal: Negative for blood in stool, constipation, diarrhea, nausea and vomiting.  Endocrine: Negative for cold intolerance, heat intolerance and polyuria.  Genitourinary: Negative for dyspareunia, dysuria, flank pain, frequency, genital sores, hematuria, menstrual problem, pelvic pain, urgency, vaginal bleeding, vaginal discharge and vaginal pain.  Musculoskeletal: Negative for back pain, joint swelling and myalgias.  Skin: Negative for rash.  Neurological: Negative for dizziness, syncope, light-headedness, numbness and headaches.  Hematological: Negative for adenopathy.  Psychiatric/Behavioral: Positive for agitation and dysphoric mood. Negative for confusion, sleep disturbance and suicidal ideas. The patient is not nervous/anxious.      Objective: BP 112/70   Pulse 80   Resp 16   Ht 5\' 7"  (1.702 m)   Wt 124 lb 6.4 oz (56.4 kg)   LMP 08/26/2020   SpO2 98%   BMI 19.48 kg/m    Physical Exam Constitutional:      Appearance: She is well-developed.  Genitourinary:     Vulva, vagina, cervix, uterus, right adnexa and left adnexa normal.     No vulval lesion or tenderness noted.     No vaginal discharge, erythema or tenderness.     No cervical polyp.     Uterus is not enlarged or tender.     No right or left adnexal mass present.     Right adnexa not tender.     Left adnexa not tender.  Neck:     Thyroid: No thyromegaly.  Cardiovascular:     Rate and Rhythm: Normal rate and regular  rhythm.     Heart sounds: Normal heart sounds. No murmur heard.   Pulmonary:     Effort: Pulmonary effort is normal.     Breath sounds: Normal breath sounds.  Chest:     Breasts:        Right: No mass, nipple discharge, skin change or tenderness.        Left: No mass, nipple discharge, skin change or tenderness.  Abdominal:     Palpations: Abdomen is soft.     Tenderness: There is no abdominal tenderness. There is  no guarding.  Musculoskeletal:        General: Normal range of motion.     Cervical back: Normal range of motion.  Neurological:     General: No focal deficit present.     Mental Status: She is alert and oriented to person, place, and time.     Cranial Nerves: No cranial nerve deficit.  Skin:    General: Skin is warm and dry.  Psychiatric:        Mood and Affect: Mood normal.        Behavior: Behavior normal.        Thought Content: Thought content normal.        Judgment: Judgment normal.  Vitals reviewed.     Assessment/Plan: Encounter for annual routine gynecological examination  Screening for STD (sexually transmitted disease) - Plan: Cervicovaginal ancillary only  Encounter for surveillance of contraceptive pills - Plan: norethindrone-ethinyl estradiol-iron (BLISOVI FE 1.5/30) 1.5-30 MG-MCG tablet; OCP RF  Dysmenorrhea - Sx improved with OCPs. Cont meds. - Plan: norethindrone-ethinyl estradiol-iron (BLISOVI FE 1.5/30) 1.5-30 MG-MCG tablet             Meds ordered this encounter  Medications  . norethindrone-ethinyl estradiol-iron (BLISOVI FE 1.5/30) 1.5-30 MG-MCG tablet    Sig: Take 1 tablet by mouth daily.    Dispense:  84 tablet    Refill:  3    Order Specific Question:   Supervising Provider    Answer:   Gae Dry [[id number]]    GYN counsel adequate intake of calcium and vitamin D     F/U  Return in about 1 year (around 09/20/2021).  Alicia B. Copland, PA-C 09/20/2020 10:30 AM

## 2020-09-21 LAB — CERVICOVAGINAL ANCILLARY ONLY
Chlamydia: NEGATIVE
Comment: NEGATIVE
Comment: NORMAL
Neisseria Gonorrhea: NEGATIVE

## 2020-10-08 DIAGNOSIS — F411 Generalized anxiety disorder: Secondary | ICD-10-CM | POA: Diagnosis not present

## 2020-10-08 DIAGNOSIS — F332 Major depressive disorder, recurrent severe without psychotic features: Secondary | ICD-10-CM | POA: Diagnosis not present

## 2020-10-08 DIAGNOSIS — F5105 Insomnia due to other mental disorder: Secondary | ICD-10-CM | POA: Diagnosis not present

## 2020-11-01 DIAGNOSIS — F330 Major depressive disorder, recurrent, mild: Secondary | ICD-10-CM | POA: Diagnosis not present

## 2020-11-01 DIAGNOSIS — F5101 Primary insomnia: Secondary | ICD-10-CM | POA: Diagnosis not present

## 2020-11-01 DIAGNOSIS — F411 Generalized anxiety disorder: Secondary | ICD-10-CM | POA: Diagnosis not present

## 2020-11-07 ENCOUNTER — Telehealth: Payer: Federal, State, Local not specified - PPO | Admitting: Physician Assistant

## 2020-11-07 DIAGNOSIS — J329 Chronic sinusitis, unspecified: Secondary | ICD-10-CM | POA: Diagnosis not present

## 2020-11-07 DIAGNOSIS — R8290 Unspecified abnormal findings in urine: Secondary | ICD-10-CM

## 2020-11-07 MED ORDER — DOXYCYCLINE HYCLATE 100 MG PO TABS: 100.0000 mg | ORAL_TABLET | Freq: Two times a day (BID) | ORAL | 0 refills | Status: AC

## 2020-11-07 NOTE — Progress Notes (Signed)
MyChart Video Visit    Virtual Visit via Video Note   This visit type was conducted due to national recommendations for restrictions regarding the COVID-19 Pandemic (e.g. social distancing) in an effort to limit this patient's exposure and mitigate transmission in our community. This patient is at least at moderate risk for complications without adequate follow up. This format is felt to be most appropriate for this patient at this time. Physical exam was limited by quality of the video and audio technology used for the visit.   Patient location: Home Provider location: Provider   I discussed the limitations of evaluation and management by telemedicine and the availability of in person appointments. The patient expressed understanding and agreed to proceed.  Patient: Rhonda Cole   DOB: 10-16-1996   24 y.o. Female  MRN: [id number] Visit Date: 11/07/2020  Today's healthcare provider: Trinna Post, PA-C   Chief Complaint  Patient presents with  . Cough  I,Berit Raczkowski M Monroe Toure,acting as a scribe for Trinna Post, PA-C.,have documented all relevant documentation on the behalf of Trinna Post, PA-C,as directed by  Trinna Post, PA-C while in the presence of Trinna Post, PA-C.  Subjective    Cough This is a new problem. The current episode started 1 to 4 weeks ago. The cough is productive of sputum. Associated symptoms include headaches, nasal congestion, postnasal drip and rhinorrhea. Pertinent negatives include no chills, ear pain, fever, sore throat, shortness of breath or wheezing. She has tried OTC cough suppressant for the symptoms. The treatment provided mild relief.  Patient reports she had 2 covid tests already and both was negative. Patient reports symptoms started last Monday with head cold, stuffy nose, cough, weakness and fatigue. She thought she was getting a bit better but this past Tuesday she noticed a swollen lymph node by her ears as well as in her neck.  She reports waking up last night with facial pain, jaw, and tooth pain which increases with head movement. She reports some green nasal congestion.   She also reports malodorous urine. She denies dysuria and hematuria. She denies urinary frequency, pelvic pain. She has been drinking a new immunity booster and is not sure if this has brought about the change.    Medications: Outpatient Medications Prior to Visit  Medication Sig  . hydrOXYzine (ATARAX/VISTARIL) 25 MG tablet take 1-2 tablets by mouth at bedtime if needed for anxiety  . norethindrone-ethinyl estradiol-iron (BLISOVI FE 1.5/30) 1.5-30 MG-MCG tablet Take 1 tablet by mouth daily.  . sertraline (ZOLOFT) 100 MG tablet 200 mg daily.   Marland Kitchen venlafaxine XR (EFFEXOR-XR) 75 MG 24 hr capsule Take 75 mg by mouth daily.  . [DISCONTINUED] ondansetron (ZOFRAN) 4 MG tablet Take 1 tablet (4 mg total) by mouth every 8 (eight) hours as needed.   No facility-administered medications prior to visit.    Review of Systems  Constitutional: Negative for appetite change, chills, fatigue and fever.  HENT: Positive for congestion, dental problem (tooth and jaw pain per pt), postnasal drip, rhinorrhea, sinus pressure and sinus pain. Negative for ear pain, sneezing, sore throat and trouble swallowing.   Respiratory: Positive for cough. Negative for shortness of breath and wheezing.   Neurological: Positive for headaches. Negative for weakness.      Objective    There were no vitals taken for this visit.   Physical Exam Constitutional:      Appearance: Normal appearance.  Pulmonary:     Effort: Pulmonary effort is normal. No  respiratory distress.  Neurological:     Mental Status: She is alert.  Psychiatric:        Mood and Affect: Mood normal.        Behavior: Behavior normal.        Assessment & Plan    1. Sinusitis, unspecified chronicity, unspecified location  - doxycycline (VIBRA-TABS) 100 MG tablet; Take 1 tablet (100 mg total) by  mouth 2 (two) times daily for 7 days.  Dispense: 14 tablet; Refill: 0  2. Malodorous urine  Does not have other sx of UTI. Counseled that though I have low suspicion for UTI, doxycycline tends to treat a UTI as well.   Return if symptoms worsen or fail to improve.     I discussed the assessment and treatment plan with the patient. The patient was provided an opportunity to ask questions and all were answered. The patient agreed with the plan and demonstrated an understanding of the instructions.   The patient was advised to call back or seek an in-person evaluation if the symptoms worsen or if the condition fails to improve as anticipated.   ITrinna Post, PA-C, have reviewed all documentation for this visit. The documentation on 11/07/20 for the exam, diagnosis, procedures, and orders are all accurate and complete.  The entirety of the information documented in the History of Present Illness, Review of Systems and Physical Exam were personally obtained by me. Portions of this information were initially documented by [facility] and reviewed by me for thoroughness and accuracy.    Paulene Floor [facility] [phone] (phone) [phone] (fax)  [place]

## 2020-11-07 NOTE — Patient Instructions (Signed)

## 2020-11-14 DIAGNOSIS — F330 Major depressive disorder, recurrent, mild: Secondary | ICD-10-CM | POA: Diagnosis not present

## 2020-11-14 DIAGNOSIS — F5101 Primary insomnia: Secondary | ICD-10-CM | POA: Diagnosis not present

## 2020-11-14 DIAGNOSIS — F411 Generalized anxiety disorder: Secondary | ICD-10-CM | POA: Diagnosis not present

## 2020-11-28 DIAGNOSIS — F5101 Primary insomnia: Secondary | ICD-10-CM | POA: Diagnosis not present

## 2020-11-28 DIAGNOSIS — F411 Generalized anxiety disorder: Secondary | ICD-10-CM | POA: Diagnosis not present

## 2020-11-28 DIAGNOSIS — F330 Major depressive disorder, recurrent, mild: Secondary | ICD-10-CM | POA: Diagnosis not present

## 2020-12-11 DIAGNOSIS — Z20822 Contact with and (suspected) exposure to covid-19: Secondary | ICD-10-CM | POA: Diagnosis not present

## 2020-12-12 DIAGNOSIS — F330 Major depressive disorder, recurrent, mild: Secondary | ICD-10-CM | POA: Diagnosis not present

## 2020-12-12 DIAGNOSIS — F5101 Primary insomnia: Secondary | ICD-10-CM | POA: Diagnosis not present

## 2020-12-12 DIAGNOSIS — F411 Generalized anxiety disorder: Secondary | ICD-10-CM | POA: Diagnosis not present

## 2020-12-17 ENCOUNTER — Telehealth: Payer: Self-pay

## 2020-12-17 NOTE — Telephone Encounter (Signed)
Copied from [place] [id number]. Topic: General - Other >> Dec 17, 2020  1:44 PM Leward Quan A wrote: Reason for CRM: Patient would like a call back to schedule for a Covid Booster please Ph# [phone]

## 2021-01-02 ENCOUNTER — Ambulatory Visit: Payer: Federal, State, Local not specified - PPO

## 2021-01-03 DIAGNOSIS — F330 Major depressive disorder, recurrent, mild: Secondary | ICD-10-CM | POA: Diagnosis not present

## 2021-01-03 DIAGNOSIS — F411 Generalized anxiety disorder: Secondary | ICD-10-CM | POA: Diagnosis not present

## 2021-01-03 DIAGNOSIS — F5101 Primary insomnia: Secondary | ICD-10-CM | POA: Diagnosis not present

## 2021-01-05 DIAGNOSIS — Z20822 Contact with and (suspected) exposure to covid-19: Secondary | ICD-10-CM | POA: Diagnosis not present

## 2021-01-10 DIAGNOSIS — Z20822 Contact with and (suspected) exposure to covid-19: Secondary | ICD-10-CM | POA: Diagnosis not present

## 2021-01-10 DIAGNOSIS — F332 Major depressive disorder, recurrent severe without psychotic features: Secondary | ICD-10-CM | POA: Diagnosis not present

## 2021-01-10 DIAGNOSIS — F411 Generalized anxiety disorder: Secondary | ICD-10-CM | POA: Diagnosis not present

## 2021-01-10 DIAGNOSIS — F5105 Insomnia due to other mental disorder: Secondary | ICD-10-CM | POA: Diagnosis not present

## 2021-01-23 DIAGNOSIS — F5101 Primary insomnia: Secondary | ICD-10-CM | POA: Diagnosis not present

## 2021-01-23 DIAGNOSIS — F411 Generalized anxiety disorder: Secondary | ICD-10-CM | POA: Diagnosis not present

## 2021-01-23 DIAGNOSIS — F330 Major depressive disorder, recurrent, mild: Secondary | ICD-10-CM | POA: Diagnosis not present

## 2021-02-07 ENCOUNTER — Other Ambulatory Visit: Payer: Self-pay | Admitting: Physician Assistant

## 2021-02-07 DIAGNOSIS — R030 Elevated blood-pressure reading, without diagnosis of hypertension: Secondary | ICD-10-CM | POA: Insufficient documentation

## 2021-02-07 DIAGNOSIS — D164 Benign neoplasm of bones of skull and face: Secondary | ICD-10-CM

## 2021-02-08 ENCOUNTER — Telehealth: Payer: Self-pay | Admitting: Family Medicine

## 2021-02-08 MED ORDER — VALACYCLOVIR HCL 1 G PO TABS: 1000.0000 mg | ORAL_TABLET | Freq: Two times a day (BID) | ORAL | 3 refills | Status: DC

## 2021-02-08 NOTE — Telephone Encounter (Signed)
LM medicine refilled

## 2021-02-08 NOTE — Telephone Encounter (Signed)
Please advise 

## 2021-02-08 NOTE — Telephone Encounter (Signed)
Copied from [place] [id number]. Topic: Quick Communication - Rx Refill/Question >> Feb 08, 2021  8:54 AM Leward Quan A wrote: Medication: valACYclovir (VALTREX) 1000 MG tablet   Has the patient contacted their pharmacy? Yes.   (Agent: If no, request that the patient contact the pharmacy for the refill.) (Agent: If yes, when and what did the pharmacy advise?)  Preferred Pharmacy (with phone number or street name): [id number] - [place], [place] - [address]  Phone:  [phone] Fax:  [phone]     Agent: Please be advised that RX refills may take up to 3 business days. We ask that you follow-up with your pharmacy.

## 2021-02-08 NOTE — Telephone Encounter (Signed)
Medication requested not medication list Please advice

## 2021-02-08 NOTE — Telephone Encounter (Addendum)
Pt calling for the cold sore med Valtrex 1000 mg. Pt states Sonia Baller filled this last time and told her it could be as needed to refill. Pt state she is in a lot of pain and needs.  Pt requesting this be sent to [facility] to refill, not Dr B

## 2021-02-08 NOTE — Telephone Encounter (Signed)
refilled 

## 2021-02-11 DIAGNOSIS — Z20822 Contact with and (suspected) exposure to covid-19: Secondary | ICD-10-CM | POA: Diagnosis not present

## 2021-02-20 ENCOUNTER — Ambulatory Visit
Admission: RE | Admit: 2021-02-20 | Discharge: 2021-02-20 | Disposition: A | Discharge status: Home / Self Care | Payer: Federal, State, Local not specified - PPO | Source: Ambulatory Visit | Attending: Physician Assistant | Admitting: Physician Assistant

## 2021-02-20 ENCOUNTER — Other Ambulatory Visit: Payer: Self-pay

## 2021-02-20 DIAGNOSIS — D164 Benign neoplasm of bones of skull and face: Secondary | ICD-10-CM | POA: Diagnosis not present

## 2021-03-19 DIAGNOSIS — F5101 Primary insomnia: Secondary | ICD-10-CM | POA: Diagnosis not present

## 2021-03-19 DIAGNOSIS — F330 Major depressive disorder, recurrent, mild: Secondary | ICD-10-CM | POA: Diagnosis not present

## 2021-03-19 DIAGNOSIS — F411 Generalized anxiety disorder: Secondary | ICD-10-CM | POA: Diagnosis not present

## 2021-04-04 DIAGNOSIS — F5105 Insomnia due to other mental disorder: Secondary | ICD-10-CM | POA: Diagnosis not present

## 2021-04-04 DIAGNOSIS — F332 Major depressive disorder, recurrent severe without psychotic features: Secondary | ICD-10-CM | POA: Diagnosis not present

## 2021-04-04 DIAGNOSIS — F411 Generalized anxiety disorder: Secondary | ICD-10-CM | POA: Diagnosis not present

## 2021-04-04 DIAGNOSIS — Z79899 Other long term (current) drug therapy: Secondary | ICD-10-CM | POA: Diagnosis not present

## 2021-04-17 ENCOUNTER — Telehealth: Payer: Federal, State, Local not specified - PPO | Admitting: Physician Assistant

## 2021-04-17 DIAGNOSIS — J019 Acute sinusitis, unspecified: Secondary | ICD-10-CM

## 2021-04-17 DIAGNOSIS — B9689 Other specified bacterial agents as the cause of diseases classified elsewhere: Secondary | ICD-10-CM | POA: Diagnosis not present

## 2021-04-17 MED ORDER — DOXYCYCLINE HYCLATE 100 MG PO TABS: 100.0000 mg | ORAL_TABLET | Freq: Two times a day (BID) | ORAL | 0 refills | Status: DC

## 2021-04-17 NOTE — Progress Notes (Signed)

## 2021-04-17 NOTE — Progress Notes (Signed)
I have spent 5 minutes in review of e-visit questionnaire, review and updating patient chart, medical decision making and response to patient.   Allen Basista Cody Yonatan Guitron, PA-C    

## 2021-04-29 ENCOUNTER — Ambulatory Visit: Payer: Self-pay | Admitting: *Deleted

## 2021-04-29 NOTE — Telephone Encounter (Signed)
Pt called in wanting to speak with a nurse regarding her prescription for doxycycline (VIBRA-TABS) 100 MG tablet, pt had some questions on how to take this medication Called patient to review medication. No answer, no voicemail box has been set up, unable to leave message.

## 2021-04-29 NOTE — Telephone Encounter (Signed)
Made 3 attempts to reach pt and left 2 voice mails. Last VM instructed pt to call back if needed. Closing out chart.

## 2021-05-28 ENCOUNTER — Telehealth: Payer: Self-pay

## 2021-05-28 NOTE — Telephone Encounter (Signed)
Pt aware.

## 2021-05-28 NOTE — Telephone Encounter (Signed)
Take UPT. If neg, reassurance. Did she start new pack OCPs when due to start 2 wks ago? If so, cont pills. Can be normal to miss periods on OCPs, as long as taking every day. If didn't restart them and has been sex active, then wait to restart them with next menses. Condoms in meantime.

## 2021-05-28 NOTE — Telephone Encounter (Signed)
Pt calling; is on bc; no period yet; was due to start 2wks ago; LMP 4/18th; isn't exactly taking at the same time every day but about the same time.  Pt has been on bc for 69yrs and the this has never happened before.

## 2021-06-10 DIAGNOSIS — Z20822 Contact with and (suspected) exposure to covid-19: Secondary | ICD-10-CM | POA: Diagnosis not present

## 2021-06-16 ENCOUNTER — Other Ambulatory Visit: Payer: Self-pay | Admitting: Obstetrics and Gynecology

## 2021-06-16 DIAGNOSIS — Z3041 Encounter for surveillance of contraceptive pills: Secondary | ICD-10-CM

## 2021-06-16 DIAGNOSIS — N946 Dysmenorrhea, unspecified: Secondary | ICD-10-CM

## 2021-07-08 DIAGNOSIS — F332 Major depressive disorder, recurrent severe without psychotic features: Secondary | ICD-10-CM | POA: Diagnosis not present

## 2021-07-08 DIAGNOSIS — F411 Generalized anxiety disorder: Secondary | ICD-10-CM | POA: Diagnosis not present

## 2021-07-08 DIAGNOSIS — F5105 Insomnia due to other mental disorder: Secondary | ICD-10-CM | POA: Diagnosis not present

## 2021-07-11 ENCOUNTER — Encounter: Payer: Self-pay | Admitting: Family Medicine

## 2021-07-11 ENCOUNTER — Telehealth: Payer: Self-pay

## 2021-07-11 ENCOUNTER — Telehealth (INDEPENDENT_AMBULATORY_CARE_PROVIDER_SITE_OTHER): Payer: Federal, State, Local not specified - PPO | Admitting: Family Medicine

## 2021-07-11 VITALS — Ht 67.0 in | Wt 124.0 lb

## 2021-07-11 DIAGNOSIS — R300 Dysuria: Secondary | ICD-10-CM | POA: Diagnosis not present

## 2021-07-11 DIAGNOSIS — R5382 Chronic fatigue, unspecified: Secondary | ICD-10-CM | POA: Diagnosis not present

## 2021-07-11 DIAGNOSIS — R519 Headache, unspecified: Secondary | ICD-10-CM

## 2021-07-11 DIAGNOSIS — U099 Post covid-19 condition, unspecified: Secondary | ICD-10-CM | POA: Diagnosis not present

## 2021-07-11 DIAGNOSIS — G8929 Other chronic pain: Secondary | ICD-10-CM

## 2021-07-11 LAB — POCT URINALYSIS DIPSTICK
Bilirubin, UA: NEGATIVE
Glucose, UA: NEGATIVE
Ketones, UA: NEGATIVE
Nitrite, UA: NEGATIVE
Protein, UA: NEGATIVE
Spec Grav, UA: 1.01 (ref 1.010–1.025)
Urobilinogen, UA: 0.2 E.U./dL
pH, UA: 6.5 (ref 5.0–8.0)

## 2021-07-11 NOTE — Addendum Note (Signed)
Addended by: Minette Headland on: 07/11/2021 03:10 PM   Modules accepted: Orders

## 2021-07-11 NOTE — Telephone Encounter (Signed)
Copied from [place] [phone] [phone]. Topic: General - Other >> Jul 11, 2021  8:56 AM Leward Quan A wrote: Reason for CRM: Patient called in stated that she had a virtual visit need a note for work upload to My Chart please ASAP will be at work at 10 AM

## 2021-07-11 NOTE — Telephone Encounter (Signed)
Work note has been sent to patient. KW

## 2021-07-11 NOTE — Progress Notes (Signed)
MyChart Video Visit    Virtual Visit via Video Note   This visit type was conducted due to national recommendations for restrictions regarding the COVID-19 Pandemic (e.g. social distancing) in an effort to limit this patient's exposure and mitigate transmission in our community. This patient is at least at moderate risk for complications without adequate follow up. This format is felt to be most appropriate for this patient at this time. Physical exam was limited by quality of the video and audio technology used for the visit.    Patient location: home Provider location: [place] involved in the visit: patient, provider   I discussed the limitations of evaluation and management by telemedicine and the availability of in person appointments. The patient expressed understanding and agreed to proceed.  Patient: Rhonda Cole   DOB: [birth year]   25 y.o. Female  MRN: [id number] Visit Date: 07/11/2021  Today's healthcare provider: Lavon Paganini, MD   Chief Complaint  Patient presents with   Migraine   URI   Urinary Tract Infection    Subjective    HPI  Patient C/O feeling more tired, sleepy, changes in appetite, headaches in the last 4 months. Patient reports being sick more with colds and UTI's also since having COVID (43m ago) in the last four months.  She reports symptoms of: - extreme fatigue despite sleeping 9-12 hours per night - nightmares - brain fog - night sweats - frequent headaches - seem to start on R side of head near osteoma, nagging in quality - migraines weekly - sometimes with sharp stabbing pain - worse when on period - changes in appetite - sometimes very hungry, sometimes not at all - dizzy and lightheaded intermittently - worsening tremors - swollen lymph nodes intermittently - missed a period 3 months ago but regular now  Feels like she has a UTI - dysuria, smelly urine, stomach cramps. Feels like it is getting better  though.   Mom has had frequent UTIs.  Feels like she had another about a month ago (was seen by [employer] and not tested. Treated with abx). Thinking of taking a probiotic daily to prevent it.  She is very stressed at work and feels this is contributing to her symptoms. She is about to quit her job.  Patient reports she tapering off Venlafaxine XR this week she is taking 37.5mg  daily then she will be off medication. Feels like that is worsening her mental health. She is on Zoloft now. Seeing Dr Nicolasa Ducking.  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    valACYclovir (VALTREX) 1000 MG tablet Take 1 tablet (1,000 mg total) by mouth 2 (two) times daily.   [DISCONTINUED] venlafaxine XR (EFFEXOR-XR) 75 MG 24 hr capsule Take by mouth daily.   venlafaxine XR (EFFEXOR-XR)  37.5 MG 24 hr capsule Take 37.5 mg by mouth daily.   [DISCONTINUED] doxycycline (VIBRA-TABS) 100 MG tablet Take 1 tablet (100 mg total) by mouth 2 (two) times daily.   No facility-administered medications prior to visit.    Review of Systems  Constitutional:  Positive for activity change, appetite change, diaphoresis and fatigue.  Respiratory:  Negative for shortness of breath.   Cardiovascular:  Negative for chest pain and palpitations.  Gastrointestinal:  Positive for abdominal pain. Negative  for diarrhea, nausea and vomiting.  Genitourinary:  Positive for dysuria, frequency and urgency. Negative for vaginal discharge.  Neurological:  Positive for dizziness and headaches.  Psychiatric/Behavioral:  Positive for sleep disturbance. The patient is nervous/anxious.       Objective    Ht 5\' 7"  (1.702 m)   Wt 124 lb (56.2 kg)   LMP 07/08/2021 (Exact Date)   BMI 19.42 kg/m     Physical Exam Constitutional:      Appearance: Normal appearance.  HENT:     Head: Normocephalic and atraumatic.  Pulmonary:     Effort: Pulmonary effort is normal. No respiratory distress.  Neurological:     Mental Status: She is alert and oriented to person, place, and time. Mental status is at baseline.       Assessment & Plan     1. Frequent headaches 2. Post-COVID chronic headache - worsening after having COVID 1 month ago - do also wonder if effexor was working some as ppx and she is no longer taking it - will refer to Neuro for further eval/management - Ambulatory referral to Neurology  3. Chronic fatigue - discussed wide differential for her constellation of symptoms - will check labs as a starting place - TSH - CBC w/Diff/Platelet - Comprehensive metabolic panel - VITAMIN D 25 Hydroxy (Vit-D Deficiency, Fractures) - B12  4. Dysuria - advised her to come by for a UA this afternoon - will treat / send culture as indicated - may use probiotic and cranberry supplement as prophylaxis   Return if symptoms worsen or fail to improve.     I discussed the assessment and treatment plan with the patient. The patient was provided an opportunity to ask questions and all were answered. The patient agreed with the plan and demonstrated an understanding of the instructions.   The patient was advised to call back or seek an in-person evaluation if the symptoms worsen or if the condition fails to improve as anticipated.   I, Lavon Paganini, MD, have reviewed all documentation for this  visit. The documentation on 07/11/21 for the exam, diagnosis, procedures, and orders are all accurate and complete.   Raine Blodgett, Dionne Bucy, MD, MPH [facility]

## 2021-07-11 NOTE — Progress Notes (Signed)
Please let her know that I suspect UTI and send in macrobid 500mg  BID x5d #10 r0. Also send micro and culture for cystitis with hematuria please

## 2021-07-12 ENCOUNTER — Other Ambulatory Visit: Payer: Self-pay

## 2021-07-12 MED ORDER — VALACYCLOVIR HCL 1 G PO TABS: 1000.0000 mg | ORAL_TABLET | Freq: Two times a day (BID) | ORAL | 3 refills | Status: DC

## 2021-07-12 MED ORDER — NITROFURANTOIN MONOHYD MACRO 100 MG PO CAPS: 100.0000 mg | ORAL_CAPSULE | Freq: Two times a day (BID) | ORAL | 0 refills | Status: DC

## 2021-07-12 NOTE — Telephone Encounter (Signed)
-----   Message from Virginia Crews, MD sent at 07/11/2021  3:26 PM EDT ----- Please let her know that I suspect UTI and send in macrobid 500mg  BID x5d #10 r0. Also send micro and culture for cystitis with hematuria please

## 2021-07-13 LAB — URINALYSIS, MICROSCOPIC ONLY: Casts: NONE SEEN /lpf

## 2021-07-15 LAB — URINE CULTURE

## 2021-07-16 ENCOUNTER — Telehealth: Payer: Self-pay

## 2021-07-16 NOTE — Telephone Encounter (Signed)
Copied from [place] [id number]. Topic: Appointment Scheduling - Scheduling Inquiry for Clinic >> Jul 16, 2021  2:08 PM Erick Blinks wrote: Reason for CRM: Pt called back per most recent virtual visit. She says that PCP suggested lab work and patient wants to come in asap once orders are ready. Please advise   Best contact: [phone]

## 2021-07-18 NOTE — Telephone Encounter (Signed)
Called and left detailed voice message, in patients chart from virtual visit on 07/11/21 labs were ordered, advised on message of lab hours to get drawn, labs are non fasting. KW

## 2021-07-29 DIAGNOSIS — B029 Zoster without complications: Secondary | ICD-10-CM

## 2021-07-29 HISTORY — DX: Zoster without complications: B02.9

## 2021-08-07 DIAGNOSIS — F411 Generalized anxiety disorder: Secondary | ICD-10-CM | POA: Diagnosis not present

## 2021-08-07 DIAGNOSIS — F5101 Primary insomnia: Secondary | ICD-10-CM | POA: Diagnosis not present

## 2021-08-07 DIAGNOSIS — F330 Major depressive disorder, recurrent, mild: Secondary | ICD-10-CM | POA: Diagnosis not present

## 2021-08-09 ENCOUNTER — Encounter: Payer: Self-pay | Admitting: Family Medicine

## 2021-08-19 ENCOUNTER — Other Ambulatory Visit: Payer: Self-pay

## 2021-08-19 DIAGNOSIS — F332 Major depressive disorder, recurrent severe without psychotic features: Secondary | ICD-10-CM | POA: Diagnosis not present

## 2021-08-19 DIAGNOSIS — N946 Dysmenorrhea, unspecified: Secondary | ICD-10-CM

## 2021-08-19 DIAGNOSIS — F411 Generalized anxiety disorder: Secondary | ICD-10-CM | POA: Diagnosis not present

## 2021-08-19 DIAGNOSIS — Z3041 Encounter for surveillance of contraceptive pills: Secondary | ICD-10-CM

## 2021-08-19 DIAGNOSIS — F5105 Insomnia due to other mental disorder: Secondary | ICD-10-CM | POA: Diagnosis not present

## 2021-08-19 MED ORDER — NORETHIN ACE-ETH ESTRAD-FE 1.5-30 MG-MCG PO TABS: 1.0000 | ORAL_TABLET | Freq: Every day | ORAL | 0 refills | Status: DC

## 2021-08-19 NOTE — Telephone Encounter (Signed)
Pt calling for refill of bc; annual sched for 9/30th.  [phone] Pt aware refill eRx'd.

## 2021-08-21 ENCOUNTER — Encounter: Payer: Self-pay | Admitting: Family Medicine

## 2021-08-22 ENCOUNTER — Ambulatory Visit: Payer: Self-pay | Admitting: *Deleted

## 2021-08-22 ENCOUNTER — Telehealth: Payer: Self-pay

## 2021-08-22 NOTE — Telephone Encounter (Signed)
Appt scheduled with [facility] 08/23/21.

## 2021-08-22 NOTE — Telephone Encounter (Signed)
Copied from [place] [phone]. Topic: Appointment Scheduling - Scheduling Inquiry for Clinic >> Aug 22, 2021  8:22 AM Oneta Rack wrote: Patient has a cluster of blisters on the bridge of nose for 3 days. Patient thinks it may be allergic reaction to her glasses. Seeking clinical advice (sent a CRM to the practice for a possible work in )

## 2021-08-22 NOTE — Telephone Encounter (Signed)
Patient will be seen tomorrow at [facility] at 1:20 Dr. Zigmund Daniel

## 2021-08-22 NOTE — Telephone Encounter (Signed)
I returned pt's call.   She had called in c/o having blisters on the left side of her nose from an old pair of glasses she wore a couple of days ago.   The blisters burn, itch, and hurt and are not getting better.  "I have to wear my glasses it's not a choice".     No appts available with [facility] for timeframe indicated on the protocol of 24 hrs so I have sent a message seeing if they can possibly work her in.   She is agreeable to seeing any provider and was agreeable to being called back for an appt.

## 2021-08-22 NOTE — Telephone Encounter (Signed)
Reason for Disposition  [1] Localized rash is very painful AND [2] no fever  Answer Assessment - Initial Assessment Questions 1. APPEARANCE of RASH: "Describe the rash."      I have blisters on my nose from my glasses.   It's a small cluster of 10 blisters on my nose on one side.   It's from the nose bridge on my glasses.  Is there anything else I can do?   I did wear a pair of glasses that I don't usually wear 2 days ago and I think that started this. 2. LOCATION: "Where is the rash located?"      It's on the left side of my nose where my glasses rest. 3. NUMBER: "How many spots are there?"      10 clear fluid filled blisters 4. SIZE: "How big are the spots?" (Inches, centimeters or compare to size of a coin)      They are like pinhead sized or like a pencil dot. 5. ONSET: "When did the rash start?"      2 days ago 6. ITCHING: "Does the rash itch?" If Yes, ask: "How bad is the itch?"  (Scale 0-10; or none, mild, moderate, severe)     Itching, burning and hurting.   7. PAIN: "Does the rash hurt?" If Yes, ask: "How bad is the pain?"  (Scale 0-10; or none, mild, moderate, severe)    - NONE (0): no pain    - MILD (1-3): doesn't interfere with normal activities     - MODERATE (4-7): interferes with normal activities or awakens from sleep     - SEVERE (8-10): excruciating pain, unable to do any normal activities     Yes they burn, itch and hurt.     I put Neosporin on them the other day but it didn't help. 8. OTHER SYMPTOMS: "Do you have any other symptoms?" (e.g., fever)     No 9. PREGNANCY: "Is there any chance you are pregnant?" "When was your last menstrual period?"     Not asked  Protocols used: Rash or Redness - Localized-A-AH

## 2021-08-22 NOTE — Telephone Encounter (Signed)
Can we see if anyone could see her in case it's shingles or something similar.

## 2021-08-23 ENCOUNTER — Other Ambulatory Visit: Payer: Self-pay

## 2021-08-23 ENCOUNTER — Ambulatory Visit: Payer: Federal, State, Local not specified - PPO | Admitting: Family Medicine

## 2021-08-23 ENCOUNTER — Encounter: Payer: Self-pay | Admitting: Family Medicine

## 2021-08-23 VITALS — BP 122/80 | HR 90 | Temp 98.5°F | Ht 67.0 in | Wt 123.0 lb

## 2021-08-23 DIAGNOSIS — L089 Local infection of the skin and subcutaneous tissue, unspecified: Secondary | ICD-10-CM | POA: Insufficient documentation

## 2021-08-23 MED ORDER — CEPHALEXIN 500 MG PO CAPS: 500.0000 mg | ORAL_CAPSULE | Freq: Two times a day (BID) | ORAL | 0 refills | Status: DC

## 2021-08-23 MED ORDER — VALACYCLOVIR HCL 1 G PO TABS: 1000.0000 mg | ORAL_TABLET | Freq: Three times a day (TID) | ORAL | 0 refills | Status: AC

## 2021-08-23 NOTE — Progress Notes (Signed)
Primary Care / Sports Medicine Office Visit  Patient Information:  Patient ID: Rhonda Cole, female DOB: 1996/09/26 Age: 25 y.o. MRN: [id number]   Rhonda Cole is a pleasant 25 y.o. 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   hydrOXYzine (ATARAX/VISTARIL) 25 MG tablet take 1-2 tablets by mouth at bedtime if needed for anxiety   norethindrone-ethinyl estradiol-iron (HAILEY FE 1.5/30) 1.5-30 MG-MCG tablet Take 1 tablet by mouth daily.   sertraline (ZOLOFT) 100 MG tablet Take 200 mg by mouth daily.   valACYclovir (VALTREX) 1000 MG tablet Take 1 tablet (1,000 mg total) by mouth 3 (three) times daily for 7 days.   [DISCONTINUED] valACYclovir (VALTREX) 1000 MG tablet Take 1 tablet (1,000 mg total) by mouth 2 (two) times daily.   [DISCONTINUED] nitrofurantoin, macrocrystal-monohydrate, (MACROBID) 100 MG capsule Take 1 capsule (100 mg total) by mouth 2 (two) times daily.   [DISCONTINUED] venlafaxine XR (EFFEXOR-XR) 37.5 MG 24 hr capsule Take 37.5 mg by mouth daily.   No  facility-administered encounter medications on file as of 08/23/2021.   Past Surgical History:  Procedure Laterality Date   HERNIA REPAIR  1999   double when she was a baby   INGUINAL HERNIA REPAIR Bilateral 2001   TYMPANOSTOMY TUBE PLACEMENT  2000   x2   WISDOM TOOTH EXTRACTION  2018    Vitals:   08/23/21 1317  BP: 122/80  Pulse: 90  Temp: 98.5 F (36.9 C)  SpO2: 97%   Vitals:   08/23/21 1317  Weight: 123 lb (55.8 kg)  Height: '5\' 7"'$  (1.702 m)   Body mass index is 19.26 kg/m.  No results found.   Independent interpretation of notes and tests performed by another provider:   None  Procedures performed:   None  Pertinent History, Exam, Impression, and Recommendations:   Skin infection Patient presenting with onset of left nasal vesiculopustular lesions from earlier this week, greater than 72 hours from patient history.  She states that the day prior to symptom onset she had used an older pair of glasses, noted significant friction on her nasal bridge from these glasses, and the evening of the next day she began to note pain, bumps, and redness.  She describes the pain as burning, pruritic, denies any radiation of symptoms, has noted swollen lymph node in her left neck.  She denies any visual change, no eye pain, no nasal discharge, no ear pain, no throat pain, no cardiopulmonary complaints, no involvement circumferentially.  She  trialed Neosporin at onset and states that she noted worsening thereafter.  Since onset swelling has increased and she has noted more lesions.  She denies similar symptoms in the past.  No sick contacts related.  She has been attempting to keep the area clean and dry.  Physical examination reveals a roughly 3 x 4 area of clustered vesiculopustular lesions (some which are umbilicated) on the left half of her nose, these are overlying an erythematous base that does not cross the midline. There is no involvement in the nares or the turbinates, no  conjunctival/ocular involvement, no lesions across the zygomatic region or elsewhere, tympanic membranes and canals benign, tender lymphadenopathy in the posterior cervical chain on the left, benign cardiopulmonary findings.  Her clinical history and findings raise concern for primarily a viral skin infection, secondarily bacterial infection to be considered.  I reviewed the same with the patient as well as treatment course and strategy, she will initiate scheduled valacyclovir with concern over herpes zoster, gentle skin care to the area, and low threshold to initiate Keflex if symptoms fail to improve or worsen by early next week.  I discussed the potential for cross-reactivity with Keflex and her penicillin allergy, she will observe for any significant rash.  She is to follow-up with her primary care if symptoms fail to improve despite the aforementioned measures towards the end of next week.  Additionally, I have advised that if she notices severe, worsening pain, ocular involvement, to seek immediate medical attention.  Patient has vocalized understanding and is amenable to this plan.    Orders & Medications Meds ordered this encounter  Medications   valACYclovir (VALTREX) 1000 MG tablet    Sig: Take 1 tablet (1,000 mg total) by mouth 3 (three) times daily for 7 days.    Dispense:  20 tablet    Refill:  0   cephALEXin (KEFLEX) 500 MG capsule    Sig: Take 1 capsule (500 mg total) by mouth 2 (two) times daily.    Dispense:  14 capsule    Refill:  0   No orders of the defined types were placed in this encounter.    Return if symptoms worsen or fail to improve.     Montel Culver, MD   Primary Care Sports Medicine [place]

## 2021-08-23 NOTE — Patient Instructions (Addendum)
-   Dose valacyclovir 3 times daily until complete - If symptoms fail to improve or if they worsen, start antibiotic, dose twice daily until complete (recommend continuation of valacyclovir in addition) - Keep skin clean and dry, avoid topical creams/lotions to minimize irritation - While bumps are present, can be considered contagious, avoid contact as discussed - If symptoms persist despite the above towards the end of next week, schedule a follow-up appointment with your primary care provider - If any severe worsening of symptoms, pain, vision involvement, seek immediate attention - Contact us for any questions between now and then

## 2021-08-23 NOTE — Assessment & Plan Note (Signed)
Patient presenting with onset of left nasal vesiculopustular lesions from earlier this week, greater than 72 hours from patient history.  She states that the day prior to symptom onset she had used an older pair of glasses, noted significant friction on her nasal bridge from these glasses, and the evening of the next day she began to note pain, bumps, and redness.  She describes the pain as burning, pruritic, denies any radiation of symptoms, has noted swollen lymph node in her left neck.  She denies any visual change, no eye pain, no nasal discharge, no ear pain, no throat pain, no cardiopulmonary complaints, no involvement circumferentially.  She trialed Neosporin at onset and states that she noted worsening thereafter.  Since onset swelling has increased and she has noted more lesions.  She denies similar symptoms in the past.  No sick contacts related.  She has been attempting to keep the area clean and dry.  Physical examination reveals a roughly 3 x 4 area of clustered vesiculopustular lesions (some which are umbilicated) on the left half of her nose, these are overlying an erythematous base that does not cross the midline. There is no involvement in the nares or the turbinates, no conjunctival/ocular involvement, no lesions across the zygomatic region or elsewhere, tympanic membranes and canals benign, tender lymphadenopathy in the posterior cervical chain on the left, benign cardiopulmonary findings.  Her clinical history and findings raise concern for primarily a viral skin infection, secondarily bacterial infection to be considered.  I reviewed the same with the patient as well as treatment course and strategy, she will initiate scheduled valacyclovir with concern over herpes zoster, gentle skin care to the area, and low threshold to initiate Keflex if symptoms fail to improve or worsen by early next week.  I discussed the potential for cross-reactivity with Keflex and her penicillin allergy, she will  observe for any significant rash.  She is to follow-up with her primary care if symptoms fail to improve despite the aforementioned measures towards the end of next week.  Additionally, I have advised that if she notices severe, worsening pain, ocular involvement, to seek immediate medical attention.  Patient has vocalized understanding and is amenable to this plan.

## 2021-08-28 ENCOUNTER — Encounter: Payer: Self-pay | Admitting: Family Medicine

## 2021-08-29 NOTE — Telephone Encounter (Signed)
Please advise.  Patient has not started Keflex.

## 2021-09-17 ENCOUNTER — Telehealth: Payer: Self-pay | Admitting: Family Medicine

## 2021-09-17 DIAGNOSIS — L089 Local infection of the skin and subcutaneous tissue, unspecified: Secondary | ICD-10-CM

## 2021-09-17 NOTE — Telephone Encounter (Signed)
This pt was seen at [place] by this provider for [place] so this should be treated as if it were prescribed here.  We received a fax requesting this again.

## 2021-09-17 NOTE — Telephone Encounter (Signed)
Requested medications are due for refill today No  Requested medications are on the active medication list Yes  Last refill 08/23/21  Last visit 08/23/21  Future visit scheduled no  Notes to clinic Was only ordered for a 7 day period, please assess.

## 2021-09-18 ENCOUNTER — Other Ambulatory Visit: Payer: Self-pay

## 2021-09-18 NOTE — Telephone Encounter (Signed)
Please advise 

## 2021-09-18 NOTE — Telephone Encounter (Signed)
Patient requesting refills on medication. She was seen by Dr. Jacinto Reap on 07/2021 for blisters on her nose. Ok to refill? Please advise. Thanks!

## 2021-09-19 MED ORDER — VALACYCLOVIR HCL 1 G PO TABS: 1000.0000 mg | ORAL_TABLET | Freq: Two times a day (BID) | ORAL | 0 refills | Status: DC

## 2021-09-25 ENCOUNTER — Other Ambulatory Visit: Payer: Self-pay | Admitting: Physician Assistant

## 2021-09-27 ENCOUNTER — Ambulatory Visit (INDEPENDENT_AMBULATORY_CARE_PROVIDER_SITE_OTHER): Payer: Federal, State, Local not specified - PPO | Admitting: Advanced Practice Midwife

## 2021-09-27 ENCOUNTER — Encounter: Payer: Self-pay | Admitting: Advanced Practice Midwife

## 2021-09-27 ENCOUNTER — Other Ambulatory Visit: Payer: Self-pay

## 2021-09-27 VITALS — BP 120/76 | HR 78 | Ht 68.0 in | Wt 128.0 lb

## 2021-09-27 DIAGNOSIS — N946 Dysmenorrhea, unspecified: Secondary | ICD-10-CM | POA: Diagnosis not present

## 2021-09-27 DIAGNOSIS — Z0000 Encounter for general adult medical examination without abnormal findings: Secondary | ICD-10-CM | POA: Diagnosis not present

## 2021-09-27 DIAGNOSIS — Z3041 Encounter for surveillance of contraceptive pills: Secondary | ICD-10-CM

## 2021-09-27 MED ORDER — NORETHIN ACE-ETH ESTRAD-FE 1.5-30 MG-MCG PO TABS: 1.0000 | ORAL_TABLET | Freq: Every day | ORAL | 4 refills | Status: DC

## 2021-09-27 NOTE — Progress Notes (Signed)
Gynecology Annual Exam  Date of Service: 09/27/2021  PCP: Virginia Crews, MD  Chief Complaint:  Chief Complaint  Patient presents with   Gynecologic Exam    Annual - refill OCP. RM 5    History of Present Illness: Patient is a 25 y.o. G0P0000 presents for annual exam. The patient has no gyn complaints today. Discussed her intermenstrual bleeding may be due to OCP switch from brand to generic.  LMP: Patient's last menstrual period was 09/02/2021. Menarche:16 Average Interval: regular, 28 days Duration of flow:  3-4  days Heavy Menses: no Clots: no Intermenstrual Bleeding: yes Postcoital Bleeding: no Dysmenorrhea: no  The patient is sexually active. She currently uses OCP (estrogen/progesterone) for contraception. She denies dyspareunia.  The patient does perform self breast exams.  There is no notable family history of breast or ovarian cancer in her family.  The patient wears seatbelts: yes.  The patient has regular exercise:  she does regular cardio, stretching, weights, yoga, she admits healthy diet, hydration, sleep .    The patient denies current symptoms of depression.  Medication controlled.  Review of Systems: Review of Systems  Constitutional:  Positive for malaise/fatigue. Negative for chills and fever.  HENT:  Negative for congestion, ear discharge, ear pain, hearing loss, sinus pain and sore throat.   Eyes:  Negative for blurred vision and double vision.  Respiratory:  Negative for cough, shortness of breath and wheezing.   Cardiovascular:  Negative for chest pain, palpitations and leg swelling.  Gastrointestinal:  Negative for abdominal pain, blood in stool, constipation, diarrhea, heartburn, melena, nausea and vomiting.  Genitourinary:  Negative for dysuria, flank pain, frequency, hematuria and urgency.  Musculoskeletal:  Negative for back pain, joint pain and myalgias.  Skin:  Negative for itching and rash.  Neurological:  Positive for headaches.  Negative for dizziness, tingling, tremors, sensory change, speech change, focal weakness, seizures, loss of consciousness and weakness.  Endo/Heme/Allergies:  Negative for environmental allergies. Does not bruise/bleed easily.  Psychiatric/Behavioral:  Negative for depression, hallucinations, memory loss, substance abuse and suicidal ideas. The patient is not nervous/anxious and does not have insomnia.    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 Contraception: OCP (estrogen/progesterone) Last Pap: 2020 Results were:  no abnormalities   Obstetric History: G0P0000  Family History:  Family History  Problem Relation Age of Onset   Migraines Mother    Hyperlipidemia Father    Hypertension Father    Arthritis Father    Asthma Sister    Alzheimer's disease Maternal Grandmother    Arthritis Maternal Grandmother    Hyperlipidemia Paternal Grandfather    Hypertension Paternal Grandfather    Cancer Paternal Grandfather        COLON; LUNG; LYMPHOMA; ?   Heart failure Paternal Grandfather    Heart attack Paternal Grandfather    Cancer Other  BREAST    Social History:  Social History   Socioeconomic History   Marital status: Single    Spouse name: Not on file   Number of children: Not on file   Years of education: Not on file   Highest education level: Not on file  Occupational History   Not on  file  Tobacco Use   Smoking status: Never   Smokeless tobacco: Never  Vaping Use   Vaping Use: Never used  Substance and Sexual Activity   Alcohol use: Not Currently   Drug use: Never   Sexual activity: Yes    Partners: Male    Birth control/protection: Pill  Other Topics Concern   Not on file  Social History Narrative   Not on file   Social Determinants of Health   Financial Resource Strain: Not on file  Food Insecurity: Not on file  Transportation Needs: Not on file  Physical Activity: Not on file  Stress: Not on file  Social Connections: Not on file  Intimate Partner Violence: Not on file    Allergies:  Allergies  Allergen Reactions   Augmentin [Amoxicillin-Pot Clavulanate] Rash    Medications: Prior to Admission medications   Medication Sig Start Date End Date Taking? Authorizing Provider  sertraline (ZOLOFT) 100 MG tablet Take 200 mg by mouth daily. 08/20/18  Yes [provider]  hydrOXYzine (ATARAX/VISTARIL) 25 MG tablet take 1-2 tablets by mouth at bedtime if needed for anxiety 11/23/17   [provider]  norethindrone-ethinyl estradiol-iron (HAILEY FE 1.5/30) 1.5-30 MG-MCG tablet Take 1 tablet by mouth daily. 09/27/21 12/20/21  Rod Can, CNM  valACYclovir (VALTREX) 1000 MG tablet Take 1 tablet (1,000 mg total) by mouth 2 (two) times daily for 10 days. 09/19/21 09/29/21  Jerrol Banana., MD    Physical Exam Vitals: Blood pressure 120/76, pulse 78, height 5\' 8"  (1.727 m), weight 128 lb (58.1 kg), last menstrual period 09/02/2021.  General: NAD HEENT: normocephalic, anicteric Thyroid: no enlargement, no palpable nodules Pulmonary: No increased work of breathing, CTAB Cardiovascular: RRR, distal pulses 2+ Breast: Breast symmetrical, no tenderness, no palpable nodules or masses, no skin or nipple retraction present, no nipple discharge.  No axillary or supraclavicular lymphadenopathy. Abdomen: NABS, soft, non-tender, non-distended.   Umbilicus without lesions.  No hepatomegaly, splenomegaly or masses palpable. No evidence of hernia  Genitourinary: deferred for no concerns/PAP interval Extremities: no edema, erythema, or tenderness Neurologic: Grossly intact Psychiatric: mood appropriate, affect full   Assessment: 25 y.o. G0P0000 routine annual exam  Plan: Problem List Items Addressed This Visit   None Visit Diagnoses     Well woman exam without gynecological exam    -  Primary   Encounter for surveillance of contraceptive pills       Relevant Medications   norethindrone-ethinyl estradiol-iron (HAILEY FE 1.5/30) 1.5-30 MG-MCG tablet   Dysmenorrhea       Sx improved with OCPs. Cont meds.   Relevant Medications   norethindrone-ethinyl estradiol-iron (HAILEY FE 1.5/30) 1.5-30 MG-MCG tablet       1) 4) Gardasil Series discussed and if applicable offered to patient - Patient has not previously completed 3 shot series   2) STI screening  was offered and declined  3)  ASCCP guidelines and rationale discussed.  Patient opts for every 3 years screening interval  4) Contraception - the patient is currently using  OCP (estrogen/progesterone).  She is happy with her current form of contraception and plans to continue We discussed safe sex practices to  reduce her furture risk of STI's.    5) Return in about 1 year (around 09/27/2022) for annual established gyn.   Rod Can, [facility] 09/27/2021, 3:07 PM

## 2021-10-21 ENCOUNTER — Telehealth: Payer: Self-pay

## 2021-10-21 NOTE — Telephone Encounter (Signed)
That's fine. We'll start checkign in at her next visit with me.

## 2021-10-21 NOTE — Telephone Encounter (Signed)
Copied from [place] [id number] [id number]. Topic: General - Inquiry >> Oct 21, 2021 10:47 AM Greggory Keen D wrote: Pt called asking if Dr. B could start prescribing her Zoloft for her instead of the psychiatrist.  She said she has been on the Zoloft for years and just feels she does not need to see both providers.  CB#  [phone]

## 2021-10-22 NOTE — Telephone Encounter (Signed)
Patient advised, appt scheduled.

## 2021-10-25 ENCOUNTER — Ambulatory Visit: Payer: Federal, State, Local not specified - PPO | Admitting: Family Medicine

## 2021-10-25 ENCOUNTER — Encounter: Payer: Self-pay | Admitting: Family Medicine

## 2021-10-25 ENCOUNTER — Other Ambulatory Visit: Payer: Self-pay

## 2021-10-25 VITALS — BP 111/74 | HR 84 | Temp 98.6°F | Ht 68.0 in | Wt 129.1 lb

## 2021-10-25 DIAGNOSIS — F411 Generalized anxiety disorder: Secondary | ICD-10-CM | POA: Diagnosis not present

## 2021-10-25 MED ORDER — SERTRALINE HCL 100 MG PO TABS: 200.0000 mg | ORAL_TABLET | Freq: Every day | ORAL | 1 refills | Status: DC

## 2021-10-25 NOTE — Progress Notes (Signed)
Established patient visit   Patient: Rhonda Cole   DOB: 07/29/1996   25 y.o. Female  MRN: [id number] Visit Date: 10/25/2021  Today's healthcare provider: Lavon Paganini, MD   Chief Complaint  Patient presents with   Anxiety   Subjective    HPI  -Patient would like to be seen here in office to have zoloft refilled instead of being seen by psychiatrist. -Patient reported she had shingles in August 2022 located on her nose.   Anxiety, Follow-up  She was last seen for anxiety 17 months ago. Changes made at last visit include adding calorie rich foods into diet and adding protein shakes in between meals along with psychiatry and continuing current medication.   She reports excellent compliance with treatment. She reports excellent tolerance with treatment. She is not having side effects.   She feels her anxiety is mild and Improved since last visit.  Symptoms: No chest pain Yes difficulty concentrating  No dizziness Yes fatigue  No feelings of losing control No insomnia  No irritable No palpitations  No panic attacks No racing thoughts  No shortness of breath No sweating  Yes tremors/shakes    GAD-7 Results No flowsheet data found.  PHQ-9 Scores PHQ9 SCORE ONLY 10/25/2021 08/09/2020 04/21/2019  PHQ-9 Total Score 8 3 17     ---------------------------------------------------------------------------------------------------     Medications: Outpatient Medications Prior to Visit  Medication Sig   hydrOXYzine (ATARAX/VISTARIL) 25 MG tablet take 1-2 tablets by mouth at bedtime if needed for anxiety   norethindrone-ethinyl estradiol-iron (HAILEY FE 1.5/30) 1.5-30 MG-MCG tablet Take 1 tablet by mouth daily.   [DISCONTINUED] sertraline (ZOLOFT) 100 MG tablet Take 200 mg by mouth daily.   No facility-administered medications prior to visit.    Review of Systems - per HPI     Objective    BP 111/74 (BP Location: Left Arm, Patient Position: Sitting, Cuff  Size: Normal)   Pulse 84   Temp 98.6 F (37 C) (Oral)   Ht 5\' 8"  (1.727 m)   Wt 129 lb 1.6 oz (58.6 kg)   LMP 10/06/2021 (Approximate)   SpO2 100%   BMI 19.63 kg/m  BP Readings from Last 3 Encounters:  10/25/21 111/74  09/27/21 120/76  08/23/21 122/80   Wt Readings from Last 3 Encounters:  10/25/21 129 lb 1.6 oz (58.6 kg)  09/27/21 128 lb (58.1 kg)  08/23/21 123 lb (55.8 kg)      Physical Exam Vitals reviewed.  Constitutional:      General: She is not in acute distress.    Appearance: She is well-developed.  HENT:     Head: Normocephalic and atraumatic.  Eyes:     General: No scleral icterus.    Conjunctiva/sclera: Conjunctivae normal.  Cardiovascular:     Rate and Rhythm: Normal rate and regular rhythm.  Pulmonary:     Effort: Pulmonary effort is normal. No respiratory distress.  Skin:    General: Skin is warm and dry.     Findings: No rash.  Neurological:     Mental Status: She is alert and oriented to person, place, and time.  Psychiatric:        Behavior: Behavior normal.      No results found for any visits on 10/25/21.  Assessment & Plan     Problem List Items Addressed This Visit       Other   GAD (generalized anxiety disorder) - Primary    Chronic and well contorlled No longer seeing psych Continue  zoloft at current dose Continue therapy      Relevant Medications   sertraline (ZOLOFT) 100 MG tablet     Return in about 6 months (around 04/25/2022) for CPE.      I, Lavon Paganini, MD, have reviewed all documentation for this visit. The documentation on 10/25/21 for the exam, diagnosis, procedures, and orders are all accurate and complete.   Noriah Osgood, Dionne Bucy, MD, MPH [facility]

## 2021-10-25 NOTE — Assessment & Plan Note (Signed)
Chronic and well contorlled No longer seeing psych Continue zoloft at current dose Continue therapy

## 2021-11-06 ENCOUNTER — Telehealth: Payer: Self-pay

## 2021-11-06 NOTE — Telephone Encounter (Signed)
Copied from [place] [id number]. Topic: General - Other >> Nov 06, 2021 12:25 PM Leward Quan A wrote: Reason for CRM: Patient called in stated that she is very sick for the past few days head congestion and stuffy nose making breathing difficult. Would like a call back to schedule with Ms Rollene Rotunda or Ms Thedore Mins did not need nurse triage. Please call patient with an answer at Ph# [phone]

## 2021-11-07 ENCOUNTER — Encounter: Payer: Self-pay | Admitting: Family Medicine

## 2021-11-07 ENCOUNTER — Other Ambulatory Visit: Payer: Self-pay

## 2021-11-07 ENCOUNTER — Ambulatory Visit: Payer: Federal, State, Local not specified - PPO | Admitting: Family Medicine

## 2021-11-07 VITALS — BP 129/86 | HR 89 | Temp 98.5°F | Resp 16 | Wt 128.1 lb

## 2021-11-07 DIAGNOSIS — J029 Acute pharyngitis, unspecified: Secondary | ICD-10-CM | POA: Diagnosis not present

## 2021-11-07 DIAGNOSIS — J020 Streptococcal pharyngitis: Secondary | ICD-10-CM | POA: Insufficient documentation

## 2021-11-07 DIAGNOSIS — N898 Other specified noninflammatory disorders of vagina: Secondary | ICD-10-CM | POA: Insufficient documentation

## 2021-11-07 MED ORDER — CEPHALEXIN 500 MG PO CAPS: 500.0000 mg | ORAL_CAPSULE | Freq: Two times a day (BID) | ORAL | 0 refills | Status: DC

## 2021-11-07 NOTE — Progress Notes (Signed)
Established patient visit   Patient: Rhonda Cole   DOB: 1996/05/30   25 y.o. Female  MRN: [id number] Visit Date: 11/07/2021  Today's healthcare provider: Gwyneth Sprout, FNP   Chief Complaint  Patient presents with   Sore Throat   Vaginal Discharge   Subjective    Sore Throat  This is a new problem. The current episode started in the past 7 days. The problem has been gradually worsening. There has been no fever. The pain is at a severity of 8/10. Associated symptoms include ear pain, a hoarse voice, a plugged ear sensation, neck pain, swollen glands and trouble swallowing. Pertinent negatives include no abdominal pain, congestion, coughing, diarrhea, drooling, ear discharge, headaches, shortness of breath, stridor or vomiting. She has had no exposure to strep. She has tried acetaminophen for the symptoms. The treatment provided no relief.  Vaginal Discharge The patient's primary symptoms include vaginal discharge. This is a new problem. The current episode started in the past 7 days. The problem has been unchanged. Associated symptoms include a sore throat. Pertinent negatives include no abdominal pain, anorexia, back pain, chills, constipation, diarrhea, discolored urine, dysuria, fever, flank pain, frequency, headaches, hematuria, joint pain, joint swelling, nausea, painful intercourse, rash, urgency or vomiting. The vaginal discharge was green and thick.      Medications: Outpatient Medications Prior to Visit  Medication Sig   hydrOXYzine (ATARAX/VISTARIL) 25 MG tablet take 1-2 tablets by mouth at bedtime if needed for anxiety   norethindrone-ethinyl estradiol-iron (HAILEY FE 1.5/30) 1.5-30 MG-MCG tablet Take 1 tablet by mouth daily.   sertraline (ZOLOFT) 100 MG tablet Take 2 tablets (200 mg total) by mouth daily.   No facility-administered medications prior to visit.    Review of Systems  Constitutional:  Negative for chills and fever.  HENT:  Positive for ear pain,  hoarse voice, sore throat and trouble swallowing. Negative for congestion, drooling and ear discharge.   Respiratory:  Negative for cough, shortness of breath and stridor.   Gastrointestinal:  Negative for abdominal pain, anorexia, constipation, diarrhea, nausea and vomiting.  Genitourinary:  Positive for vaginal discharge. Negative for dysuria, flank pain, frequency, hematuria and urgency.  Musculoskeletal:  Positive for neck pain. Negative for back pain and joint pain.  Skin:  Negative for rash.  Neurological:  Negative for headaches.      Objective    BP 129/86   Pulse 89   Temp 98.5 F (36.9 C) (Oral)   Resp 16   Wt 128 lb 1.6 oz (58.1 kg)   SpO2 100%   BMI 19.48 kg/m    Physical Exam Vitals and nursing note reviewed.  Constitutional:      General: She is not in acute distress.    Appearance: Normal appearance. She is normal weight. She is not ill-appearing, toxic-appearing or diaphoretic.  HENT:     Head: Normocephalic and atraumatic.     Mouth/Throat:     Pharynx: Uvula midline. Pharyngeal swelling, oropharyngeal exudate and posterior oropharyngeal erythema present.  Cardiovascular:     Rate and Rhythm: Normal rate and regular rhythm.     Pulses: Normal pulses.     Heart sounds: Normal heart sounds. No murmur heard.   No friction rub. No gallop.  Pulmonary:     Effort: Pulmonary effort is normal. No respiratory distress.     Breath sounds: Normal breath sounds. No stridor. No wheezing, rhonchi or rales.  Chest:     Chest wall: No tenderness.  Abdominal:  General: Bowel sounds are normal.     Palpations: Abdomen is soft.  Musculoskeletal:        General: No swelling, tenderness, deformity or signs of injury. Normal range of motion.     Right lower leg: No edema.     Left lower leg: No edema.  Skin:    General: Skin is warm and dry.     Capillary Refill: Capillary refill takes less than 2 seconds.     Coloration: Skin is not jaundiced or pale.     Findings:  No bruising, erythema, lesion or rash.  Neurological:     General: No focal deficit present.     Mental Status: She is alert and oriented to person, place, and time. Mental status is at baseline.     Cranial Nerves: No cranial nerve deficit.     Sensory: No sensory deficit.     Motor: No weakness.     Coordination: Coordination normal.  Psychiatric:        Mood and Affect: Mood normal.        Behavior: Behavior normal.        Thought Content: Thought content normal.        Judgment: Judgment normal.     No results found for any visits on 11/07/21.  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      Vonna Kotyk, FNP, have reviewed all documentation for this visit. The documentation on 11/07/21 for the exam, diagnosis, procedures, and orders are all accurate and complete.    Gwyneth Sprout, [place] [phone] (phone) [phone] (fax)  [place]

## 2021-11-07 NOTE — Assessment & Plan Note (Signed)
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

## 2021-11-09 LAB — NUSWAB VAGINITIS PLUS (VG+)
Candida albicans, NAA: NEGATIVE
Candida glabrata, NAA: NEGATIVE
Chlamydia trachomatis, NAA: NEGATIVE
Neisseria gonorrhoeae, NAA: NEGATIVE
Trich vag by NAA: NEGATIVE

## 2021-11-13 LAB — SPECIMEN STATUS REPORT

## 2021-11-13 LAB — UPPER RESPIRATORY CULTURE, ROUTINE

## 2022-01-06 DIAGNOSIS — Z113 Encounter for screening for infections with a predominantly sexual mode of transmission: Secondary | ICD-10-CM | POA: Diagnosis not present

## 2022-02-28 ENCOUNTER — Ambulatory Visit: Payer: Federal, State, Local not specified - PPO | Admitting: Physician Assistant

## 2022-02-28 ENCOUNTER — Encounter: Payer: Self-pay | Admitting: Physician Assistant

## 2022-02-28 ENCOUNTER — Other Ambulatory Visit: Payer: Self-pay

## 2022-02-28 VITALS — BP 114/77 | HR 118 | Temp 99.9°F | Resp 16 | Ht 68.0 in | Wt 128.7 lb

## 2022-02-28 DIAGNOSIS — R0989 Other specified symptoms and signs involving the circulatory and respiratory systems: Secondary | ICD-10-CM

## 2022-02-28 DIAGNOSIS — R6889 Other general symptoms and signs: Secondary | ICD-10-CM | POA: Diagnosis not present

## 2022-02-28 DIAGNOSIS — J020 Streptococcal pharyngitis: Secondary | ICD-10-CM

## 2022-02-28 DIAGNOSIS — J029 Acute pharyngitis, unspecified: Secondary | ICD-10-CM | POA: Diagnosis not present

## 2022-02-28 DIAGNOSIS — Z20822 Contact with and (suspected) exposure to covid-19: Secondary | ICD-10-CM | POA: Diagnosis not present

## 2022-02-28 LAB — POCT RAPID STREP A (OFFICE): Rapid Strep A Screen: POSITIVE — AB

## 2022-02-28 LAB — POCT INFLUENZA A/B
Influenza A, POC: NEGATIVE
Influenza B, POC: NEGATIVE

## 2022-02-28 MED ORDER — CEPHALEXIN 500 MG PO CAPS: 500.0000 mg | ORAL_CAPSULE | Freq: Two times a day (BID) | ORAL | 0 refills | Status: AC

## 2022-02-28 NOTE — Progress Notes (Signed)
?  ? ? ?Established patient visit ? ? ?Patient: Rhonda Cole   DOB: 06-17-1996   25 y.o. Female  MRN: [id number] ?Visit Date: 02/28/2022 ? ?Today's healthcare provider: Dani Gobble Merrilee Ancona, PA-C  ?Introduced myself to the patient as a Journalist, newspaper and provided education on APPs in clinical practice.  ? ? ?Chief Complaint  ?Patient presents with  ? URI  ? ?Subjective  ?  ?HPI ?HPI   ? ? URI   ?Associated symptoms inlclude achiness, congestion, chills, headache, sore throat and swollen glands (Covid test at home was negative).  Recent episode started in the past 7 days (Started on Wednesday).  The problem has been gradually worsening since onset.  The maximum temperature recorded prior to his arrival was 100.4-100.9 F.  Patient  is drinking moderate amounts of fluids.  Past hisotry is significant for  no history of pneumonia or bronchitis.  Patient is not a smoker. ? ?  ?  ?Last edited by Doristine Devoid, CMA on 02/28/2022 10:55 AM.  ?  ?  ? ?States symptoms began Wed with fatigue ?States now it has progressed to chills, fever, nausea, body aches, headaches ?Admits to mild throat irritation and feels like her tonsils are swollen ?Denies cough ?States she has mild sinus congestion and pressure  ? ?States she has always had issues with getting strep - states she gets strep almost once a year. Would like referral to ENT.  ? ?COVID test from [facility] was negative as were home tests  ?Flu was negative today in office ?Strep A was positive in office.  ? ? ?Medications: ?Outpatient Medications Prior to Visit  ?Medication Sig  ? hydrOXYzine (ATARAX/VISTARIL) 25 MG tablet take 1-2 tablets by mouth at bedtime if needed for anxiety  ? sertraline (ZOLOFT) 100 MG tablet Take 2 tablets (200 mg total) by mouth daily.  ? norethindrone-ethinyl estradiol-iron (HAILEY FE 1.5/30) 1.5-30 MG-MCG tablet Take 1 tablet by mouth daily.  ? [DISCONTINUED] cephALEXin (KEFLEX) 500 MG capsule Take 1 capsule (500 mg total) by mouth 2 (two) times daily.  ? ?No  facility-administered medications prior to visit.  ? ? ?Review of Systems  ?Constitutional:  Positive for chills, diaphoresis, fatigue and fever.  ?HENT:  Positive for ear pain, sinus pressure, sore throat and trouble swallowing. Negative for congestion.   ?Respiratory:  Negative for cough and shortness of breath.   ?Cardiovascular:  Negative for chest pain and palpitations.  ?Gastrointestinal:  Positive for nausea. Negative for diarrhea and vomiting.  ?Genitourinary:  Negative for difficulty urinating and dysuria.  ?Musculoskeletal:  Positive for arthralgias and myalgias.  ?Skin:  Negative for rash.  ?Neurological:  Positive for headaches. Negative for dizziness and light-headedness.  ?Hematological:  Positive for adenopathy.  ? ? ?  Objective  ?  ?BP 114/77 (BP Location: Left Arm, Patient Position: Sitting, Cuff Size: Normal)   Pulse (!) 118   Temp 99.9 ?F (37.7 ?C) (Oral)   Resp 16   Ht 5\' 8"  (1.727 m)   Wt 128 lb 11.2 oz (58.4 kg)   BMI 19.57 kg/m?  ? ? ?Physical Exam ?Vitals reviewed.  ?Constitutional:   ?   General: She is awake.  ?   Appearance: Normal appearance. She is well-developed and well-groomed.  ?HENT:  ?   Head: Normocephalic and atraumatic.  ?   Right Ear: Hearing, ear canal and external ear normal. Tympanic membrane is retracted.  ?   Left Ear: Hearing, ear canal and external ear normal. Tympanic membrane is erythematous.  ?  Mouth/Throat:  ?   Mouth: Mucous membranes are moist.  ?   Pharynx: Uvula midline. Posterior oropharyngeal erythema present. No pharyngeal swelling, oropharyngeal exudate or uvula swelling.  ?   Tonsils: 1+ on the right. 1+ on the left.  ?Cardiovascular:  ?   Rate and Rhythm: Regular rhythm. Tachycardia present.  ?   Pulses: Normal pulses.  ?   Heart sounds: Normal heart sounds.  ?Pulmonary:  ?   Effort: Pulmonary effort is normal.  ?   Breath sounds: Normal breath sounds. No decreased breath sounds, wheezing, rhonchi or rales.  ?Lymphadenopathy:  ?   Head:  ?   Right  side of head: No submental or submandibular adenopathy.  ?   Left side of head: No submental or submandibular adenopathy.  ?   Cervical: Cervical adenopathy present.  ?   Right cervical: Superficial cervical adenopathy present.  ?   Left cervical: Superficial cervical adenopathy present.  ?   Upper Body:  ?   Right upper body: No supraclavicular adenopathy.  ?   Left upper body: No supraclavicular adenopathy.  ?Neurological:  ?   Mental Status: She is alert.  ?Psychiatric:     ?   Behavior: Behavior is cooperative.  ?  ? ? ?Results for orders placed or performed in visit on 02/28/22  ?POCT Influenza A/B  ?Result Value Ref Range  ? Influenza A, POC Negative Negative  ? Influenza B, POC Negative Negative  ?POCT rapid strep A  ?Result Value Ref Range  ? Rapid Strep A Screen Positive (A) Negative  ? ? Assessment & Plan  ?  ? ?Problem List Items Addressed This Visit   ? ?  ? Respiratory  ? Pharyngitis due to Streptococcus species  ?  Acute, recurrent issue ?Symptoms have been going on since Wednesday ?Patient is negative for flu and COVID  ?Will treat strep with Cephalexin 500mg  PO BID x10 days  ?Recommend she stay well hydrated, use OTC ibuprofen and tylenol for fever/ body aches.  ?Patient reports frequent, yearly strep pharyngitis infections and is requesting referral to see if there is in underlying issue or if her tonsils can be taken out due to frequent swelling and illnesses ?Referral to ENT placed today.  ?Follow up as needed.  ?  ?  ? Relevant Medications  ? cephALEXin (KEFLEX) 500 MG capsule  ?  ? Other  ? Sore throat  ? Relevant Orders  ? POCT rapid strep A (Completed)  ? ?Other Visit Diagnoses   ? ? Strep pharyngitis    -  Primary  ? Relevant Medications  ? cephALEXin (KEFLEX) 500 MG capsule  ? Flu-like symptoms      ? Relevant Orders  ? POCT Influenza A/B (Completed)  ? Tonsil symptom      ? Relevant Orders  ? Ambulatory referral to ENT  ? ?  ? ? ? ?No follow-ups on file. ? ? ?I, Reakwon Barren E Pradyun Ishman, PA-C, have  reviewed all documentation for this visit. The documentation on 02/28/22 for the exam, diagnosis, procedures, and orders are all accurate and complete. ? ? ?Xaviar Lunn, Glennie Isle MPH ?[place] ?[facility] ? ? ?No follow-ups on file.  ?   ? ? ?Jaxon Mynhier E Nayel Purdy, PA-C  ?[place] ?[phone] (phone) ?[phone] (fax) ? ?[facility]  ?

## 2022-02-28 NOTE — Assessment & Plan Note (Signed)
Acute, recurrent issue ?Symptoms have been going on since Wednesday ?Patient is negative for flu and COVID  ?Will treat strep with Cephalexin 500mg  PO BID x10 days  ?Recommend she stay well hydrated, use OTC ibuprofen and tylenol for fever/ body aches.  ?Patient reports frequent, yearly strep pharyngitis infections and is requesting referral to see if there is in underlying issue or if her tonsils can be taken out due to frequent swelling and illnesses ?Referral to ENT placed today.  ?Follow up as needed.  ?

## 2022-02-28 NOTE — Patient Instructions (Signed)
Your rapid Strep test was positive so I am sending in an antibiotic for you  ?Cephalexin 500 mg to be taken by mouth twice per day. ?Take this with food and stay well hydrated to assist with recovery ?Try to increase sources of "good bacteria" while on the antibiotic- this includes yogurts, probiotics, kombucha, etc. This can help prevent antibiotic GI upset ?Try to rest as much as possible while recovering ?You can alternate Ibuprofen and Tylenol for fever and body aches.  ? ?I have sent in a referral to ENT for your recurrent Strep and tonsil concerns ?The referral team should call to schedule that soon ? ?Let us know if your symptoms are not improving or worsening.  ? ?It was nice to meet you and I appreciate the opportunity to be involved in your care ? ? ?

## 2022-03-11 DIAGNOSIS — J3501 Chronic tonsillitis: Secondary | ICD-10-CM | POA: Diagnosis not present

## 2022-03-11 DIAGNOSIS — J309 Allergic rhinitis, unspecified: Secondary | ICD-10-CM | POA: Diagnosis not present

## 2022-04-08 ENCOUNTER — Ambulatory Visit: Payer: Federal, State, Local not specified - PPO | Admitting: Physician Assistant

## 2022-04-08 ENCOUNTER — Encounter: Payer: Self-pay | Admitting: Physician Assistant

## 2022-04-08 ENCOUNTER — Other Ambulatory Visit (HOSPITAL_COMMUNITY)
Admission: RE | Admit: 2022-04-08 | Discharge: 2022-04-08 | Disposition: A | Discharge status: Home / Self Care | Payer: Federal, State, Local not specified - PPO | Source: Ambulatory Visit | Attending: Physician Assistant | Admitting: Physician Assistant

## 2022-04-08 VITALS — BP 125/81 | HR 96 | Temp 98.1°F | Ht 68.0 in | Wt 126.1 lb

## 2022-04-08 DIAGNOSIS — R7989 Other specified abnormal findings of blood chemistry: Secondary | ICD-10-CM | POA: Insufficient documentation

## 2022-04-08 DIAGNOSIS — J029 Acute pharyngitis, unspecified: Secondary | ICD-10-CM | POA: Diagnosis not present

## 2022-04-08 DIAGNOSIS — J069 Acute upper respiratory infection, unspecified: Secondary | ICD-10-CM | POA: Diagnosis not present

## 2022-04-08 DIAGNOSIS — B955 Unspecified streptococcus as the cause of diseases classified elsewhere: Secondary | ICD-10-CM | POA: Insufficient documentation

## 2022-04-08 LAB — POCT RAPID STREP A (OFFICE): Rapid Strep A Screen: POSITIVE — AB

## 2022-04-08 MED ORDER — CEFDINIR 300 MG PO CAPS: 300.0000 mg | ORAL_CAPSULE | Freq: Two times a day (BID) | ORAL | 0 refills | Status: AC

## 2022-04-08 NOTE — Assessment & Plan Note (Signed)
POC strep test +  ?Will try omnicef 300 mg BID x 5 days, if she does not feel improvement in 5 days can extend to 10. ?Will send for culture as well ?Will culture for chlamydia/gonorrhea. ?

## 2022-04-08 NOTE — Assessment & Plan Note (Addendum)
Will check CMP/CBC/ tsh/t4 . Concern over immune status d/t covid, shingles, and strep x 3 in 4 months ?Advised pt to go back to ENT to discuss possible colonization w/ strep, culture may help with treatment guidance. ?Can continue holistic immune boost--vit c, elderberry, zinc ? ?

## 2022-04-08 NOTE — Assessment & Plan Note (Signed)
Historically, may have been an acute reactant, advised to check when she is feeling better. ?

## 2022-04-08 NOTE — Progress Notes (Signed)
? ?Acute Office Visit ? ?Subjective:  ? ? Patient ID: Rhonda Cole, female    DOB: Sep 19, 1996, 26 y.o.   MRN: [id number] ? ?Cc. Sore throat ? ?HPI ?Doll is a 26 y/o female who presents today with concerns over sore throat, painful swallowing, fatigue, swollen lymph nodes x 2 days. 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 ?She was dx with strep throat 02/28/2022 and tx with 10 days of keflex, 11/07/2022 treated with keflex. Allergy to augmentin--rash 7-8 years ago.  ? ?She was diagnosed with covid 6/22 and shingles 8/22.  ? ?Not currently sexually active, historically sexually active at times w/o protection in the last year.  ? ?Past Medical History:  ?Diagnosis Date  ? Anxiety   ? Mild depression   ? Osteoma of skull   ? Shingles outbreak 07/2021  ? nose  ? Vaccine for human papilloma virus (HPV) types 6, 11, 16, and 18 administered   ? ? ?Past Surgical History:  ?Procedure Laterality Date  ? HERNIA REPAIR  1999  ? double when she was a baby  ? INGUINAL HERNIA REPAIR Bilateral 2001  ? TYMPANOSTOMY TUBE PLACEMENT  2000  ? x2  ? [place] EXTRACTION  2018  ? ? ?Family History  ?Problem Relation Age of Onset  ? Migraines Mother   ? Hyperlipidemia Father   ? Hypertension Father   ? Arthritis Father   ? Asthma Sister   ? Alzheimer's disease Maternal Grandmother   ? Arthritis Maternal Grandmother   ? Hyperlipidemia Paternal Grandfather   ? Hypertension Paternal Grandfather   ? Cancer Paternal Grandfather   ?     COLON; LUNG; LYMPHOMA; ?  ? Heart failure Paternal Grandfather   ? Heart attack Paternal Grandfather   ? Cancer Other   ?     BREAST  ? ? ?Social History  ? ?Socioeconomic History  ? Marital status: Single  ?  Spouse name: Not on file  ? Number of children: Not on file  ? Years of education: Not on file  ? Highest education level: Not on file  ?Occupational History  ? Not on file  ?Tobacco  Use  ? Smoking status: Never  ? Smokeless tobacco: Never  ?Vaping Use  ? Vaping Use: Never used  ?Substance and Sexual Activity  ? Alcohol use: Not Currently  ? Drug use: Never  ? Sexual activity: Yes  ?  Partners: Male  ?  Birth control/protection: Pill  ?Other Topics Concern  ? Not on file  ?Social History Narrative  ? Not on file  ? ?Social Determinants of Health  ? ?Financial Resource Strain: Not on file  ?Food Insecurity: Not on file  ?Transportation Needs: Not on file  ?Physical Activity: Not on file  ?Stress: Not on file  ?Social Connections: Not on file  ?Intimate Partner Violence: Not on file  ? ? ?Outpatient Medications Prior to Visit  ?Medication Sig Dispense Refill  ? hydrOXYzine (ATARAX/VISTARIL) 25 MG tablet take 1-2 tablets by mouth at bedtime if needed for anxiety  0  ? sertraline (ZOLOFT) 100 MG tablet Take 2 tablets (200 mg total) by mouth daily. 180 tablet 1  ? cetirizine (ZYRTEC) 10 MG tablet Take 10 mg by mouth daily as needed.    ? fluticasone (FLONASE) 50 MCG/ACT nasal spray Place into both nostrils.    ? norethindrone-ethinyl estradiol-iron (HAILEY FE 1.5/30) 1.5-30  MG-MCG tablet Take 1 tablet by mouth daily. 84 tablet 4  ? ID NOW COVID-19 KIT TEST AS DIRECTED TODAY (Patient not taking: Reported on 04/08/2022)    ? ?No facility-administered medications prior to visit.  ? ? ?Allergies  ?Allergen Reactions  ? Augmentin [Amoxicillin-Pot Clavulanate] Rash  ? ? ?Review of Systems  ?Constitutional:  Negative for fatigue and fever.  ?HENT:  Positive for sore throat, trouble swallowing and voice change.   ?Respiratory:  Negative for cough and shortness of breath.   ?Cardiovascular:  Negative for chest pain and leg swelling.  ?Gastrointestinal:  Negative for abdominal pain.  ?Neurological:  Negative for dizziness and headaches.  ? ?   ?Objective:  ?  ?Physical Exam ?Constitutional:   ?   General: She is awake.  ?   Appearance: She is well-developed.  ?HENT:  ?   Head: Normocephalic.  ?   Right Ear:  Tympanic membrane normal.  ?   Left Ear: Tympanic membrane normal.  ?   Nose: No congestion.  ?   Mouth/Throat:  ?   Pharynx: Posterior oropharyngeal erythema present. No oropharyngeal exudate.  ?Eyes:  ?   Conjunctiva/sclera: Conjunctivae normal.  ?Cardiovascular:  ?   Rate and Rhythm: Normal rate and regular rhythm.  ?   Heart sounds: Normal heart sounds.  ?Pulmonary:  ?   Effort: Pulmonary effort is normal.  ?   Breath sounds: Normal breath sounds.  ?Skin: ?   General: Skin is warm.  ?Neurological:  ?   Mental Status: She is alert and oriented to person, place, and time.  ?Psychiatric:     ?   Attention and Perception: Attention normal.     ?   Mood and Affect: Mood normal.     ?   Speech: Speech normal.     ?   Behavior: Behavior is cooperative.  ? ? ?BP 125/81 (BP Location: Left Arm, Patient Position: Sitting, Cuff Size: Normal)   Pulse 96   Temp 98.1 ?F (36.7 ?C) (Oral)   Ht '5\' 8"'  (1.727 m)   Wt 126 lb 1.6 oz (57.2 kg)   SpO2 100%   BMI 19.17 kg/m?  ?Wt Readings from Last 3 Encounters:  ?04/08/22 126 lb 1.6 oz (57.2 kg)  ?02/28/22 128 lb 11.2 oz (58.4 kg)  ?11/07/21 128 lb 1.6 oz (58.1 kg)  ? ? ?Health Maintenance Due  ?Topic Date Due  ? HPV VACCINES (1 - 2-dose series) Never done  ? HIV Screening  Never done  ? Hepatitis C Screening  Never done  ? COVID-19 Vaccine (3 - Booster for Pfizer series) 04/30/2020  ? ? ?   ?Topic Date Due  ? HPV VACCINES (1 - 2-dose series) Never done  ? ? ? ?Lab Results  ?Component Value Date  ? TSH 3.780 05/23/2020  ? ?Lab Results  ?Component Value Date  ? WBC 7.5 05/23/2020  ? HGB 14.7 05/23/2020  ? HCT 44.1 05/23/2020  ? MCV 88 05/23/2020  ? PLT 223 05/23/2020  ? ?Lab Results  ?Component Value Date  ? NA 139 05/23/2020  ? K 3.4 (L) 05/23/2020  ? CO2 18 (L) 05/23/2020  ? GLUCOSE 107 (H) 05/23/2020  ? BUN 9 05/23/2020  ? CREATININE 0.89 05/23/2020  ? BILITOT 0.3 05/23/2020  ? ALKPHOS 82 05/23/2020  ? AST 15 05/23/2020  ? ALT 10 05/23/2020  ? PROT 7.6 05/23/2020  ? ALBUMIN 5.0  05/23/2020  ? CALCIUM 9.9 05/23/2020  ? ?No results found for: CHOL ?No  results found for: HDL ?No results found for: [place] ?No results found for: TRIG ?No results found for: CHOLHDL ?No results found for: HGBA1C ? ?   ?Assessment & Plan:  ? ?Problem List Items Addressed This Visit   ? ?  ? Respiratory  ? Acute pharyngitis - Primary  ?  POC strep test +  ?Will try omnicef 300 mg BID x 5 days, if she does not feel improvement in 5 days can extend to 10. ?Will send for culture as well ?Will culture for chlamydia/gonorrhea. ?  ?  ? Relevant Orders  ? POCT rapid strep A (Completed)  ? Culture, Group A Strep  ? Cervicovaginal ancillary only  ? Recurrent URI (upper respiratory infection)  ?  Will check CMP/CBC/ tsh/t4 . Concern over immune status d/t covid, shingles, and strep x 3 in 4 months ?Advised pt to go back to ENT to discuss possible colonization w/ strep, culture may help with treatment guidance. ?Can continue holistic immune boost--vit c, elderberry, zinc ? ?  ?  ? Relevant Medications  ? cefdinir (OMNICEF) 300 MG capsule  ? Other Relevant Orders  ? CBC w/Diff/Platelet  ? Comprehensive Metabolic Panel (CMET)  ? TSH + free T4  ?  ? Other  ? Elevated ferritin  ?  Historically, may have been an acute reactant, advised to check when she is feeling better. ?  ?  ? Relevant Orders  ? Iron, TIBC and Ferritin Panel  ? ?I, Mikey Kirschner, PA-C have reviewed all documentation for this visit. The documentation on  04/08/2022 for the exam, diagnosis, procedures, and orders are all accurate and complete. ? ?Mikey Kirschner, PA-C ?[place] ?[id number] ?[place], [place], [address] ?Office: [phone] ?Fax: [phone]  ? ?

## 2022-04-09 ENCOUNTER — Telehealth: Payer: Self-pay | Admitting: Physician Assistant

## 2022-04-09 NOTE — Telephone Encounter (Signed)
Copied from [place] [id number]. Topic: General - Other ?>> Apr 09, 2022 10:43 AM Leward Quan A wrote: ?Reason for CRM: Janine with [facility]ology [facility] called in to inform Mikey Kirschner that specimen sent over on 04/08/22 from vaginal swab was collected in the wrong specimen contaimer. They need to have Optima swab in order to complete testing so swab need to be recollected please with Optima swab and sent back to lab. Any questions please call Janine at Ph# [phone] ?

## 2022-04-11 LAB — CERVICOVAGINAL ANCILLARY ONLY
Chlamydia: NEGATIVE
Comment: NEGATIVE
Comment: NORMAL
Neisseria Gonorrhea: NEGATIVE

## 2022-04-11 LAB — CULTURE, GROUP A STREP: Strep A Culture: POSITIVE — AB

## 2022-04-15 ENCOUNTER — Telehealth: Payer: Self-pay | Admitting: Family Medicine

## 2022-04-15 NOTE — Telephone Encounter (Signed)
[facility] faxed refill request for the following medications: ? ?valACYclovir (VALTREX) 1000 MG tablet  ? ?Please advise. ? ?

## 2022-04-16 ENCOUNTER — Other Ambulatory Visit: Payer: Self-pay | Admitting: Physician Assistant

## 2022-04-16 MED ORDER — VALACYCLOVIR HCL 1 G PO TABS: 1000.0000 mg | ORAL_TABLET | Freq: Every day | ORAL | 0 refills | Status: AC

## 2022-04-25 ENCOUNTER — Encounter: Payer: Federal, State, Local not specified - PPO | Admitting: Family Medicine

## 2022-04-25 ENCOUNTER — Encounter: Payer: Federal, State, Local not specified - PPO | Admitting: Physician Assistant

## 2022-05-15 DIAGNOSIS — F5101 Primary insomnia: Secondary | ICD-10-CM | POA: Diagnosis not present

## 2022-05-15 DIAGNOSIS — F330 Major depressive disorder, recurrent, mild: Secondary | ICD-10-CM | POA: Diagnosis not present

## 2022-05-15 DIAGNOSIS — F411 Generalized anxiety disorder: Secondary | ICD-10-CM | POA: Diagnosis not present

## 2022-05-30 DIAGNOSIS — Z111 Encounter for screening for respiratory tuberculosis: Secondary | ICD-10-CM | POA: Diagnosis not present

## 2022-06-02 DIAGNOSIS — Z111 Encounter for screening for respiratory tuberculosis: Secondary | ICD-10-CM | POA: Diagnosis not present

## 2022-06-15 ENCOUNTER — Other Ambulatory Visit: Payer: Self-pay | Admitting: Obstetrics and Gynecology

## 2022-06-15 DIAGNOSIS — Z3041 Encounter for surveillance of contraceptive pills: Secondary | ICD-10-CM

## 2022-06-15 DIAGNOSIS — N946 Dysmenorrhea, unspecified: Secondary | ICD-10-CM

## 2022-08-13 ENCOUNTER — Other Ambulatory Visit: Payer: Self-pay | Admitting: Family Medicine

## 2022-09-15 DIAGNOSIS — Z20822 Contact with and (suspected) exposure to covid-19: Secondary | ICD-10-CM | POA: Diagnosis not present

## 2022-09-15 DIAGNOSIS — Z03818 Encounter for observation for suspected exposure to other biological agents ruled out: Secondary | ICD-10-CM | POA: Diagnosis not present

## 2022-10-17 ENCOUNTER — Ambulatory Visit: Payer: Federal, State, Local not specified - PPO | Admitting: Advanced Practice Midwife

## 2022-11-03 ENCOUNTER — Telehealth: Payer: Self-pay

## 2022-11-03 ENCOUNTER — Other Ambulatory Visit: Payer: Self-pay

## 2022-11-03 DIAGNOSIS — N946 Dysmenorrhea, unspecified: Secondary | ICD-10-CM

## 2022-11-03 DIAGNOSIS — Z3041 Encounter for surveillance of contraceptive pills: Secondary | ICD-10-CM

## 2022-11-03 MED ORDER — NORETHIN ACE-ETH ESTRAD-FE 1.5-30 MG-MCG PO TABS: 1.0000 | ORAL_TABLET | Freq: Every day | ORAL | 0 refills | Status: DC

## 2022-11-03 NOTE — Telephone Encounter (Signed)
Rhonda Cole called triage asking for a refill for her birth control to hold her over until her annual.

## 2022-11-10 DIAGNOSIS — F930 Separation anxiety disorder of childhood: Secondary | ICD-10-CM | POA: Diagnosis not present

## 2022-11-20 ENCOUNTER — Other Ambulatory Visit: Payer: Self-pay | Admitting: Advanced Practice Midwife

## 2022-11-20 DIAGNOSIS — Z3041 Encounter for surveillance of contraceptive pills: Secondary | ICD-10-CM

## 2022-11-20 DIAGNOSIS — N946 Dysmenorrhea, unspecified: Secondary | ICD-10-CM

## 2022-11-24 DIAGNOSIS — F930 Separation anxiety disorder of childhood: Secondary | ICD-10-CM | POA: Diagnosis not present

## 2022-12-01 DIAGNOSIS — F930 Separation anxiety disorder of childhood: Secondary | ICD-10-CM | POA: Diagnosis not present

## 2022-12-16 DIAGNOSIS — F930 Separation anxiety disorder of childhood: Secondary | ICD-10-CM | POA: Diagnosis not present

## 2022-12-25 ENCOUNTER — Telehealth: Payer: Federal, State, Local not specified - PPO

## 2022-12-25 ENCOUNTER — Telehealth: Payer: Federal, State, Local not specified - PPO | Admitting: Emergency Medicine

## 2022-12-25 DIAGNOSIS — N3000 Acute cystitis without hematuria: Secondary | ICD-10-CM | POA: Diagnosis not present

## 2022-12-25 MED ORDER — CEPHALEXIN 500 MG PO CAPS: 500.0000 mg | ORAL_CAPSULE | Freq: Two times a day (BID) | ORAL | 0 refills | Status: DC

## 2022-12-25 NOTE — Patient Instructions (Signed)
  Nobie Ramella, thank you for joining Montine Circle, PA-C for today's virtual visit.  While this provider is not your primary care provider (PCP), if your PCP is located in our provider database this encounter information will be shared with them immediately following your visit.   [facility] account gives you access to today's visit and all your visits, tests, and labs performed at [facility] " click here if you don't have a [facility] account or go to [email or web address]  Consent: (Patient) Curtistine Salazar provided verbal consent for this virtual visit at the beginning of the encounter.  Current Medications:  Current Outpatient Medications:    cephALEXin (KEFLEX) 500 MG capsule, Take 1 capsule (500 mg total) by mouth 2 (two) times daily., Disp: 14 capsule, Rfl: 0   AUROVELA FE 1.5/30 1.5-30 MG-MCG tablet, TAKE ONE TABLET BY MOUTH ONE TIME DAILY, Disp: 84 tablet, Rfl: 0   cetirizine (ZYRTEC) 10 MG tablet, Take 10 mg by mouth daily as needed., Disp: , Rfl:    fluticasone (FLONASE) 50 MCG/ACT nasal spray, Place into both nostrils., Disp: , Rfl:    hydrOXYzine (ATARAX/VISTARIL) 25 MG tablet, take 1-2 tablets by mouth at bedtime if needed for anxiety, Disp: , Rfl: 0   sertraline (ZOLOFT) 100 MG tablet, TAKE TWO TABLETS BY MOUTH ONE TIME DAILY, Disp: 180 tablet, Rfl: 1   Medications ordered in this encounter:  Meds ordered this encounter  Medications   cephALEXin (KEFLEX) 500 MG capsule    Sig: Take 1 capsule (500 mg total) by mouth 2 (two) times daily.    Dispense:  14 capsule    Refill:  0    Order Specific Question:   Supervising Provider    Answer:   Chase Picket [id number]     *If you need refills on other medications prior to your next appointment, please contact your pharmacy*  Follow-Up: Call back or seek an in-person evaluation if the symptoms worsen or if the condition fails to improve as anticipated.  [place] [phone]  Other Instructions    If you have been instructed to have an in-person evaluation today at a local Urgent Care facility, please use the link below. It will take you to a list of all of our available [facility], including address, phone number and hours of operation. Please do not delay care.  [facility]  If you or a family member do not have a primary care provider, use the link below to schedule a visit and establish care. When you choose a [facility] primary care physician or advanced practice provider, you gain a long-term partner in health. Find a Primary Care Provider  Learn more about 's in-office and virtual care options: [facility]

## 2022-12-25 NOTE — Progress Notes (Signed)
Virtual Visit Consent   [facility], you are scheduled for a virtual visit with a [facility] provider today. Just as with appointments in the office, your consent must be obtained to participate. Your consent will be active for this visit and any virtual visit you may have with one of our providers in the next 365 days. If you have a MyChart account, a copy of this consent can be sent to you electronically.  As this is a virtual visit, video technology does not allow for your provider to perform a traditional examination. This may limit your provider's ability to fully assess your condition. If your provider identifies any concerns that need to be evaluated in person or the need to arrange testing (such as labs, EKG, etc.), we will make arrangements to do so. Although advances in technology are sophisticated, we cannot ensure that it will always work on either your end or our end. If the connection with a video visit is poor, the visit may have to be switched to a telephone visit. With either a video or telephone visit, we are not always able to ensure that we have a secure connection.  By engaging in this virtual visit, you consent to the provision of healthcare and authorize for your insurance to be billed (if applicable) for the services provided during this visit. Depending on your insurance coverage, you may receive a charge related to this service.  I need to obtain your verbal consent now. Are you willing to proceed with your visit today? Rhonda Cole has provided verbal consent on 12/25/2022 for a virtual visit (video or telephone). Montine Circle, PA-C  Date: 12/25/2022 9:26 AM  Virtual Visit via Video Note   I, Montine Circle, connected with  Rhonda Cole  ([id number], [birth year]) on 12/25/22 at  9:30 AM EST by a video-enabled telemedicine application and verified that I am speaking with the correct person using two identifiers.  Location: Patient: Virtual Visit Location Patient:  Home Provider: Virtual Visit Location Provider: Home Office   I discussed the limitations of evaluation and management by telemedicine and the availability of in person appointments. The patient expressed understanding and agreed to proceed.    History of Present Illness: Rhonda Cole is a 26 y.o. who identifies as a female who was assigned female at birth, and is being seen today for suspected UTI.  Reports dysuria, urinary frequency and urgency.  She states that this feels like a UTI.  States that the last time she had one was about a year ago.  Denies pregnancy or breastfeeding.  Denies fever.  States that she has had some lower abdominal cramps and nausea, but no vomiting.  Denies hx of kidney stone.  Allergic to Augmentin, but can take Keflex.  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    Allergies:  Allergies  Allergen Reactions   Augmentin [Amoxicillin-Pot Clavulanate] Rash   Medications:  Current Outpatient Medications:    AUROVELA FE 1.5/30 1.5-30 MG-MCG tablet, TAKE ONE TABLET BY MOUTH ONE TIME DAILY, Disp: 84 tablet, Rfl: 0   cetirizine (ZYRTEC) 10  MG tablet, Take 10 mg by mouth daily as needed., Disp: , Rfl:    fluticasone (FLONASE) 50 MCG/ACT nasal spray, Place into both nostrils., Disp: , Rfl:    hydrOXYzine (ATARAX/VISTARIL) 25 MG tablet, take 1-2 tablets by mouth at bedtime if needed for anxiety, Disp: , Rfl: 0   sertraline (ZOLOFT) 100 MG tablet, TAKE TWO TABLETS BY MOUTH ONE TIME DAILY, Disp: 180 tablet, Rfl:  1  Observations/Objective: Patient is well-developed, well-nourished in no acute distress.  Resting comfortably at home.  Head is normocephalic, atraumatic.  No labored breathing.  Speech is clear and coherent with logical content.  Patient is alert and oriented at baseline.    Assessment and Plan: 1. Acute cystitis without hematuria  Meds ordered this encounter  Medications   cephALEXin (KEFLEX) 500 MG capsule    Sig: Take 1 capsule (500 mg total) by mouth 2 (two) times daily.    Dispense:  14 capsule    Refill:  0    Order Specific Question:   Supervising Provider    Answer:   Chase Picket [id number]   Symptoms are consistent with UTI. Doubt pyelo, no fever or vomiting.  No hx of kidney stones.  Will treat with Keflex as above.  Follow Up Instructions: I discussed the assessment and treatment plan with the patient. The patient was provided an opportunity to ask questions and all were answered. The patient agreed with the plan and demonstrated an understanding of the instructions.  A copy of instructions were sent to the patient via MyChart unless otherwise noted below.     The patient was advised to call back or seek an in-person evaluation if the symptoms worsen or if the condition fails to improve as anticipated.  Time:  I spent 12 minutes with the patient via telehealth technology discussing the above problems/concerns.    Montine Circle, PA-C

## 2022-12-30 ENCOUNTER — Ambulatory Visit: Payer: Federal, State, Local not specified - PPO | Admitting: Advanced Practice Midwife

## 2022-12-30 DIAGNOSIS — F930 Separation anxiety disorder of childhood: Secondary | ICD-10-CM | POA: Diagnosis not present

## 2023-01-15 DIAGNOSIS — F930 Separation anxiety disorder of childhood: Secondary | ICD-10-CM | POA: Diagnosis not present

## 2023-01-16 ENCOUNTER — Other Ambulatory Visit (HOSPITAL_COMMUNITY)
Admission: RE | Admit: 2023-01-16 | Discharge: 2023-01-16 | Disposition: A | Discharge status: Home / Self Care | Payer: Federal, State, Local not specified - PPO | Source: Ambulatory Visit | Attending: Advanced Practice Midwife | Admitting: Advanced Practice Midwife

## 2023-01-16 ENCOUNTER — Ambulatory Visit (INDEPENDENT_AMBULATORY_CARE_PROVIDER_SITE_OTHER): Payer: Federal, State, Local not specified - PPO | Admitting: Licensed Practical Nurse

## 2023-01-16 VITALS — BP 118/80 | HR 79 | Ht 68.0 in | Wt 133.6 lb

## 2023-01-16 DIAGNOSIS — N923 Ovulation bleeding: Secondary | ICD-10-CM

## 2023-01-16 DIAGNOSIS — Z0000 Encounter for general adult medical examination without abnormal findings: Secondary | ICD-10-CM

## 2023-01-16 DIAGNOSIS — Z01419 Encounter for gynecological examination (general) (routine) without abnormal findings: Secondary | ICD-10-CM | POA: Diagnosis not present

## 2023-01-16 DIAGNOSIS — Z3041 Encounter for surveillance of contraceptive pills: Secondary | ICD-10-CM

## 2023-01-16 DIAGNOSIS — Z124 Encounter for screening for malignant neoplasm of cervix: Secondary | ICD-10-CM

## 2023-01-16 DIAGNOSIS — N920 Excessive and frequent menstruation with regular cycle: Secondary | ICD-10-CM

## 2023-01-16 MED ORDER — NORGESTIMATE-ETH ESTRADIOL 0.25-35 MG-MCG PO TABS: 1.0000 | ORAL_TABLET | Freq: Every day | ORAL | 11 refills | Status: DC

## 2023-01-16 NOTE — Progress Notes (Signed)
Gynecology Annual Exam  PCP: Virginia Crews, MD  Chief Complaint: No chief complaint on file.   History of Present Illness: Patient is a 27 y.o. G0P0000 presents for annual exam. The patient would like to discuss switching BC formulas d/t spotting between cycles.  She has been OCP's since age 19. Her pharmacy will change the brand of her OCP based on what is available, she wonders if that is contributing to the spotting.   LMP: Patient's last menstrual period was 12/22/2022 (exact date). Menarche:16 (cycles lasted 8 days and were heavy) Average Interval: "irregular", monthly  Duration of flow:  1 to 4   days Heavy Menses: no Clots: no Intermenstrual Bleeding: yes, spots a few days after cycle stops.  Postcoital Bleeding: yes, near time of cycle  Dysmenorrhea: no  The patient is sexually active with 1 female partner. She currently uses OCP (estrogen/progesterone) for contraception. She occasionally has  dyspareunia.  The patient  occasionally   perform self breast exams.  There is no notable family history of breast or ovarian cancer in her family.  The patient wears seatbelts: yes.  The patient has regular exercise:  occasional .  Yoga, walks   The patient denies current symptoms of depression.  Has anxiety currently sees a therapist and uses medication, this is helping. 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's in [employer] for the deaf and hard of Hearing  Lives with her parents Feels safe with her current partner Wears glasses, las eye exam 1 year ago PCP Joette Catching, last seen 8 months ago   Review of Systems: ROS see HPI   Past Medical History:  Patient Active Problem List   Diagnosis Date Noted   Elevated ferritin 04/08/2022   Recurrent URI (upper respiratory infection) 04/08/2022   Pharyngitis due to Streptococcus species 11/07/2021   Vaginal discharge 11/07/2021   Acute pharyngitis 11/07/2021   GAD (generalized  anxiety disorder) 10/25/2021   Skin infection 08/23/2021   Elevated blood-pressure reading, without diagnosis of hypertension 02/07/2021   Mass of head 09/27/2018   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 Contraception: OCP (estrogen/progesterone) Last Pap: Results were: 2020 no abnormalities   Obstetric History: G0P0000  Family History:  Family History  Problem Relation Age of Onset   Migraines Mother    Hyperlipidemia Father    Hypertension Father    Arthritis Father    Asthma Sister    Alzheimer's disease Maternal Grandmother    Arthritis Maternal Grandmother    Hyperlipidemia Paternal Grandfather    Hypertension Paternal Grandfather    Cancer Paternal Grandfather        COLON; LUNG; LYMPHOMA; ?   Heart failure Paternal Grandfather    Heart attack Paternal Grandfather    Cancer Other        BREAST    Social History:  Social History   Socioeconomic History   Marital status: Single    Spouse name: Not on file   Number of children: Not on file   Years of education: Not on file   Highest education level: Not on  file  Occupational History   Not on file  Tobacco Use   Smoking status: Never   Smokeless tobacco: Never  Vaping Use   Vaping Use: Never used  Substance and Sexual Activity   Alcohol use: Not Currently   Drug use: Never   Sexual activity: Yes    Partners: Male    Birth control/protection: Pill  Other Topics Concern   Not on file  Social History Narrative   Not on file   Social Determinants of Health   Financial Resource Strain: Not on file  Food Insecurity: Not on file  Transportation Needs: Not on file  Physical Activity:  Not on file  Stress: Not on file  Social Connections: Not on file  Intimate Partner Violence: Not on file    Allergies:  Allergies  Allergen Reactions   Augmentin [Amoxicillin-Pot Clavulanate] Rash    Medications: Prior to Admission medications   Medication Sig Start Date End Date Taking? Authorizing Provider  AUROVELA FE 1.5/30 1.5-30 MG-MCG tablet TAKE ONE TABLET BY MOUTH ONE TIME DAILY 11/24/22  Yes Rod Can, CNM  hydrOXYzine (ATARAX/VISTARIL) 25 MG tablet take 1-2 tablets by mouth at bedtime if needed for anxiety 11/23/17  Yes [provider]  sertraline (ZOLOFT) 100 MG tablet TAKE TWO TABLETS BY MOUTH ONE TIME DAILY 08/13/22  Yes Bacigalupo, Dionne Bucy, MD  cephALEXin (KEFLEX) 500 MG capsule Take 1 capsule (500 mg total) by mouth 2 (two) times daily. Patient not taking: Reported on 01/16/2023 12/25/22   Montine Circle, PA-C  cetirizine (ZYRTEC) 10 MG tablet Take 10 mg by mouth daily as needed. 03/11/22   [provider]  fluticasone (FLONASE) 50 MCG/ACT nasal spray Place into both nostrils. 03/11/22   [provider]    Physical Exam Vitals: Blood pressure 118/80, pulse 79, height '5\' 8"'$  (1.727 m), weight 133 lb 9.6 oz (60.6 kg), last menstrual period 12/22/2022.  General: NAD HEENT: normocephalic, anicteric Thyroid: no enlargement, no palpable nodules Pulmonary: No increased work of breathing, CTAB Cardiovascular: RRR, distal pulses 2+ Breast: Breast symmetrical, no tenderness, no palpable nodules or masses, no skin or nipple retraction present, no nipple discharge.  No axillary or supraclavicular lymphadenopathy. Abdomen: NABS, soft, non-tender, non-distended.  Umbilicus without lesions.  No hepatomegaly, splenomegaly or masses palpable. No evidence of hernia  Genitourinary:  External: Normal external female genitalia.  Normal urethral meatus, normal Bartholin's and Skene's glands.    Vagina: Normal vaginal mucosa, no evidence of prolapse.     Cervix: Grossly normal in appearance, no bleeding, moderate amount of thick white discharge   Uterus: Non-enlarged, mobile, normal contour.  No CMT  Adnexa: ovaries non-enlarged, no adnexal masses  Rectal: deferred  Lymphatic: no evidence of inguinal lymphadenopathy Extremities: no edema, erythema, or tenderness Neurologic: Grossly intact Psychiatric: mood appropriate, affect full  Female chaperone present for pelvic and breast  portions of the physical exam    Assessment: 27 y.o. G0P0000 routine annual exam  Plan: Problem List Items Addressed This Visit   None Visit Diagnoses     Well woman exam without gynecological exam    -  Primary   Relevant Orders   Cervicovaginal ancillary only   Cytology - PAP       1) 4) Gardasil Series discussed and if applicable offered to patient - Patient has previously completed 3 shot series   2) STI screening  wasoffered and declined  3)  ASCCP guidelines and rational discussed.  Patient opts for every 3 years screening interval  4)  Contraception - the patient is currently using  OCP (estrogen/progesterone).  She is  Happy with current method but would like to try a different formula to improve bleeding profile.   We discussed safe sex practices to reduce her furture risk of STI's.    5) No follow-ups on file.   Roberto Scales, [facility] 01/16/2023, 12:31 PM

## 2023-01-19 LAB — CERVICOVAGINAL ANCILLARY ONLY
Bacterial Vaginitis (gardnerella): NEGATIVE
Candida Glabrata: NEGATIVE
Candida Vaginitis: POSITIVE — AB
Chlamydia: NEGATIVE
Comment: NEGATIVE
Comment: NEGATIVE
Comment: NEGATIVE
Comment: NEGATIVE
Comment: NEGATIVE
Comment: NORMAL
Neisseria Gonorrhea: NEGATIVE
Trichomonas: NEGATIVE

## 2023-01-20 ENCOUNTER — Encounter: Payer: Self-pay | Admitting: Licensed Practical Nurse

## 2023-01-27 LAB — CYTOLOGY - PAP: Diagnosis: NEGATIVE

## 2023-01-29 DIAGNOSIS — F930 Separation anxiety disorder of childhood: Secondary | ICD-10-CM | POA: Diagnosis not present

## 2023-02-12 ENCOUNTER — Other Ambulatory Visit: Payer: Self-pay | Admitting: Advanced Practice Midwife

## 2023-02-12 DIAGNOSIS — N946 Dysmenorrhea, unspecified: Secondary | ICD-10-CM

## 2023-02-12 DIAGNOSIS — Z3041 Encounter for surveillance of contraceptive pills: Secondary | ICD-10-CM

## 2023-02-20 ENCOUNTER — Other Ambulatory Visit: Payer: Self-pay | Admitting: Family Medicine

## 2023-02-23 NOTE — Telephone Encounter (Signed)
Requested medications are due for refill today.  yes  Requested medications are on the active medications list.  yes  Last refill. 08/13/2022 #180 1 rf  Future visit scheduled.   no  Notes to clinic.  Pt is more that 3 months overdue for office visit.     Requested Prescriptions  Pending Prescriptions Disp Refills   sertraline (ZOLOFT) 100 MG tablet [Pharmacy Med Name: SERTRALINE 100 MG TAB[*]] 180 tablet 1    Sig: TAKE TWO TABLETS BY MOUTH ONE TIME DAILY     Psychiatry:  Antidepressants - SSRI - sertraline Failed - 02/20/2023  5:42 PM      Failed - AST in normal range and within 360 days    AST  Date Value Ref Range Status  05/23/2020 15 0 - 40 IU/L Final         Failed - ALT in normal range and within 360 days    ALT  Date Value Ref Range Status  05/23/2020 10 0 - 32 IU/L Final         Failed - Valid encounter within last 6 months    Recent Outpatient Visits           10 months ago Acute pharyngitis, unspecified etiology   [place] Mikey Kirschner, PA-C   12 months ago Strep pharyngitis   [facility] Mecum, Dani Gobble, PA-C   1 year ago Pharyngitis due to Streptococcus species   [place], Elise T, FNP   1 year ago GAD (generalized anxiety disorder)   [facility] Bacigalupo, Dionne Bucy, MD   1 year ago Skin infection   [facility], MD              Passed - Completed PHQ-2 or PHQ-9 in the last 360 days

## 2023-02-23 NOTE — Telephone Encounter (Signed)
Called pt - Left message to call back and schedule appt.

## 2023-02-25 ENCOUNTER — Encounter: Payer: Self-pay | Admitting: Physician Assistant

## 2023-02-25 ENCOUNTER — Ambulatory Visit: Payer: Federal, State, Local not specified - PPO | Admitting: Physician Assistant

## 2023-02-25 VITALS — BP 117/88 | HR 90 | Temp 98.4°F | Wt 134.9 lb

## 2023-02-25 DIAGNOSIS — J029 Acute pharyngitis, unspecified: Secondary | ICD-10-CM

## 2023-02-25 DIAGNOSIS — F411 Generalized anxiety disorder: Secondary | ICD-10-CM

## 2023-02-25 DIAGNOSIS — J020 Streptococcal pharyngitis: Secondary | ICD-10-CM

## 2023-02-25 LAB — POCT RAPID STREP A (OFFICE): Rapid Strep A Screen: POSITIVE — AB

## 2023-02-25 MED ORDER — SERTRALINE HCL 100 MG PO TABS: 200.0000 mg | ORAL_TABLET | Freq: Every day | ORAL | 1 refills | Status: DC

## 2023-02-25 MED ORDER — HYDROXYZINE HCL 25 MG PO TABS: ORAL_TABLET | ORAL | 3 refills | Status: AC

## 2023-02-25 MED ORDER — CEFDINIR 300 MG PO CAPS: 300.0000 mg | ORAL_CAPSULE | Freq: Two times a day (BID) | ORAL | 0 refills | Status: AC

## 2023-02-25 NOTE — Progress Notes (Signed)
I,Connie R Striblin,acting as a Education administrator for [employer], PA-C.,have documented all relevant documentation on the behalf of Mikey Kirschner, PA-C,as directed by  Mikey Kirschner, PA-C while in the presence of Mikey Kirschner, PA-C.   Established patient visit   Patient: Rhonda Cole   DOB: 07/30/1996   27 y.o. Female  MRN: [id number] Visit Date: 02/25/2023  Today's healthcare provider: Mikey Kirschner, PA-C   Cc. Sore throat  Subjective    HPI  Sore Throat: Patient complains of sore throat. Symptoms began 3-4 days ago. Pain is of moderate severity. Fever is absent. Other associated symptoms have included sleepiness and painful lumps in back of her throat and mouth.  Fluid intake is good.  There has not been contact with an individual with known strep.  Current medications include acetaminophen.   Pt has a history of re-current strep infections.   Anxiety -Pt reports her anxiety is stable on zoloft 200 mg. She uses hydroxyzine prn, < 3 times a month.      02/25/2023    8:26 AM 02/28/2022   10:51 AM 10/25/2021   10:51 AM  PHQ9 SCORE ONLY  PHQ-9 Total Score '2 7 8    '$ Medications: Outpatient Medications Prior to Visit  Medication Sig   norgestimate-ethinyl estradiol (ORTHO-CYCLEN) 0.25-35 MG-MCG tablet Take 1 tablet by mouth daily.   [DISCONTINUED] hydrOXYzine (ATARAX/VISTARIL) 25 MG tablet take 1-2 tablets by mouth at bedtime if needed for anxiety   [DISCONTINUED] sertraline (ZOLOFT) 100 MG tablet Take 2 tablets (200 mg total) by mouth daily. Please schedule office visit before any future refills   No facility-administered medications prior to visit.    Review of Systems  Constitutional:  Negative for fatigue and fever.  HENT:  Positive for sore throat.   Respiratory:  Negative for cough and shortness of breath.   Cardiovascular:  Negative for chest pain and leg swelling.  Gastrointestinal:  Negative for abdominal pain.  Neurological:  Negative for dizziness and headaches.        Objective    Blood pressure 117/88, pulse 90, temperature 98.4 F (36.9 C), temperature source Oral, weight 134 lb 14.4 oz (61.2 kg), SpO2 99 %.   Physical Exam Constitutional:      General: She is awake.     Appearance: She is well-developed.  HENT:     Head: Normocephalic.     Mouth/Throat:     Pharynx: Posterior oropharyngeal erythema present.     Tonsils: No tonsillar exudate or tonsillar abscesses. 1+ on the right. 1+ on the left.  Eyes:     Conjunctiva/sclera: Conjunctivae normal.  Cardiovascular:     Rate and Rhythm: Normal rate and regular rhythm.     Heart sounds: Normal heart sounds.  Pulmonary:     Effort: Pulmonary effort is normal.     Breath sounds: Normal breath sounds.  Skin:    General: Skin is warm.  Neurological:     Mental Status: She is alert and oriented to person, place, and time.  Psychiatric:        Attention and Perception: Attention normal.        Mood and Affect: Mood normal.        Speech: Speech normal.        Behavior: Behavior is cooperative.      Results for orders placed or performed in visit on 02/25/23  POCT rapid strep A  Result Value Ref Range   Rapid Strep A Screen Positive (A) Negative  Assessment & Plan     Acute strep pharyngitis Poc strep A + Increase fluids All to pcn; rx omnicef 300 mg bid x 7 days   Problem List Items Addressed This Visit       Respiratory   Acute pharyngitis - Primary   Relevant Medications   cefdinir (OMNICEF) 300 MG capsule     Other   GAD (generalized anxiety disorder)    Stable, well controlled Refilled zoloft and prn hydroxyzine       Relevant Medications   sertraline (ZOLOFT) 100 MG tablet   hydrOXYzine (ATARAX) 25 MG tablet   Other Visit Diagnoses     Sore throat       Relevant Orders   POCT rapid strep A (Completed)       Return in about 6 months (around 08/26/2023) for CPE.      I, Mikey Kirschner, PA-C have reviewed all documentation for this visit. The  documentation on  02/25/23 for the exam, diagnosis, procedures, and orders are all accurate and complete.  Mikey Kirschner, PA-C [facility] [address] [place], [place], [address] Office: [phone] Fax: [place]

## 2023-02-25 NOTE — Assessment & Plan Note (Signed)
Stable, well controlled Refilled zoloft and prn hydroxyzine

## 2023-02-26 DIAGNOSIS — F930 Separation anxiety disorder of childhood: Secondary | ICD-10-CM | POA: Diagnosis not present

## 2023-03-03 ENCOUNTER — Telehealth: Payer: Federal, State, Local not specified - PPO | Admitting: Physician Assistant

## 2023-03-03 DIAGNOSIS — B3731 Acute candidiasis of vulva and vagina: Secondary | ICD-10-CM | POA: Diagnosis not present

## 2023-03-03 MED ORDER — FLUCONAZOLE 150 MG PO TABS: ORAL_TABLET | ORAL | 0 refills | Status: DC

## 2023-03-03 NOTE — Progress Notes (Signed)
Virtual Visit Consent   [facility], you are scheduled for a virtual visit with a [facility] provider today. Just as with appointments in the office, your consent must be obtained to participate. Your consent will be active for this visit and any virtual visit you may have with one of our providers in the next 365 days. If you have a MyChart account, a copy of this consent can be sent to you electronically.  As this is a virtual visit, video technology does not allow for your provider to perform a traditional examination. This may limit your provider's ability to fully assess your condition. If your provider identifies any concerns that need to be evaluated in person or the need to arrange testing (such as labs, EKG, etc.), we will make arrangements to do so. Although advances in technology are sophisticated, we cannot ensure that it will always work on either your end or our end. If the connection with a video visit is poor, the visit may have to be switched to a telephone visit. With either a video or telephone visit, we are not always able to ensure that we have a secure connection.  By engaging in this virtual visit, you consent to the provision of healthcare and authorize for your insurance to be billed (if applicable) for the services provided during this visit. Depending on your insurance coverage, you may receive a charge related to this service.  I need to obtain your verbal consent now. Are you willing to proceed with your visit today? Hana Navarrette has provided verbal consent on 03/03/2023 for a virtual visit (video or telephone). Leeanne Rio, [place]  Date: 03/03/2023 5:47 PM  Virtual Visit via Video Note   I, Leeanne Rio, connected with  Adoniah Seyoum  ([id number], 04-01-1996) on 03/03/23 at  5:30 PM EST by a video-enabled telemedicine application and verified that I am speaking with the correct person using two identifiers.  Location: Patient: Virtual Visit Location  Patient: Home Provider: Virtual Visit Location Provider: Home Office   I discussed the limitations of evaluation and management by telemedicine and the availability of in person appointments. The patient expressed understanding and agreed to proceed.    History of Present Illness: Rhonda Cole is a 27 y.o. who identifies as a female who was assigned female at birth, and is being seen today for concern of vaginal irritation and possible yeast infection.  Notes over the past 2 days noted some vaginal irritation and pruritus, now with a thick, chunky discharge.  When wiping earlier, noted a little bit of pain.  On examination she noticed a small tear of the upper right inner labia.  Denies any known trauma or injury.  Is completing a course of antibiotic for strep pharyngitis (cefdinir-last dose today).  Denies concern for STI.  Denies urinary symptoms.  Denies changes to soaps, lotions or feminine hygiene products.  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 Repeat in 3 days if needed., Disp: 2 tablet, Rfl: 0   cefdinir (OMNICEF) 300 MG capsule, Take 1 capsule (300 mg total) by mouth 2 (two) times daily for 7 days., Disp: 14 capsule, Rfl: 0   hydrOXYzine (ATARAX) 25 MG tablet, take 1-2 tablets by mouth at bedtime if needed for anxiety, Disp: 30  tablet, Rfl: 3   norgestimate-ethinyl estradiol (ORTHO-CYCLEN) 0.25-35 MG-MCG tablet, Take 1 tablet by mouth daily., Disp: 28 tablet, Rfl: 11   sertraline (ZOLOFT) 100 MG tablet, Take 2 tablets (200 mg total) by mouth daily. Please schedule office visit before any future refills, Disp: 180 tablet, Rfl: 1  Observations/Objective: Patient is well-developed, well-nourished in no acute distress.  Resting comfortably at home.  Head is normocephalic, atraumatic.  No labored breathing. Speech is clear and coherent with logical content.  Patient is alert and oriented at baseline.   Assessment and Plan: 1. Yeast vaginitis - fluconazole (DIFLUCAN) 150 MG tablet; Take 1 tablet PO once. Repeat in 3 days if needed.  Dispense: 2 tablet; Refill: 0  Causing substantial vaginal irritation and mild tear.  Supportive measures and OTC medications reviewed.  Recommend cool compresses and use of vaginal hydrocortisone preparations (Monistat or Vagisil recommended).  Thankfully she is on her last dose of antibiotic.  Will start Diflucan 150 mg once, repeating in 3 days.  If there are any symptoms that are not resolving or any new or worsening symptoms despite treatment, she is to seek an in person evaluation ASAP.  Follow Up Instructions: I discussed the assessment and treatment plan with the patient. The patient was provided an opportunity to ask questions and all were answered. The patient agreed with the plan and demonstrated an understanding of the instructions.  A copy of instructions were sent to the patient via MyChart unless otherwise noted below.   The patient was advised to call back or seek an in-person evaluation if the symptoms worsen or if the condition fails to improve as anticipated.  Time:  I spent 10 minutes with the patient via telehealth technology discussing the above problems/concerns.    Leeanne Rio, PA-C

## 2023-03-03 NOTE — Patient Instructions (Signed)
  Ulanda Dukeman, thank you for joining Leeanne Rio, PA-C for today's virtual visit.  While this provider is not your primary care provider (PCP), if your PCP is located in our provider database this encounter information will be shared with them immediately following your visit.   [facility] account gives you access to today's visit and all your visits, tests, and labs performed at [facility] " click here if you don't have a [facility] account or go to [email or web address]  Consent: (Patient) Rhonda Cole provided verbal consent for this virtual visit at the beginning of the encounter.  Current Medications:  Current Outpatient Medications:    fluconazole (DIFLUCAN) 150 MG tablet, Take 1 tablet PO once. Repeat in 3 days if needed., Disp: 2 tablet, Rfl: 0   cefdinir (OMNICEF) 300 MG capsule, Take 1 capsule (300 mg total) by mouth 2 (two) times daily for 7 days., Disp: 14 capsule, Rfl: 0   hydrOXYzine (ATARAX) 25 MG tablet, take 1-2 tablets by mouth at bedtime if needed for anxiety, Disp: 30 tablet, Rfl: 3   norgestimate-ethinyl estradiol (ORTHO-CYCLEN) 0.25-35 MG-MCG tablet, Take 1 tablet by mouth daily., Disp: 28 tablet, Rfl: 11   sertraline (ZOLOFT) 100 MG tablet, Take 2 tablets (200 mg total) by mouth daily. Please schedule office visit before any future refills, Disp: 180 tablet, Rfl: 1   Medications ordered in this encounter:  Meds ordered this encounter  Medications   fluconazole (DIFLUCAN) 150 MG tablet    Sig: Take 1 tablet PO once. Repeat in 3 days if needed.    Dispense:  2 tablet    Refill:  0    Order Specific Question:   Supervising Provider    Answer:   Chase Picket [id number]     *If you need refills on other medications prior to your next appointment, please contact your pharmacy*  Follow-Up: Call back or seek an in-person evaluation if the symptoms worsen or if the condition fails to improve as anticipated.  [place] [phone]  Other Instructions Please be very careful/gentle when wiping the area as the vaginal tissue is quite irritated from current yeast infection. Thankfully today is her last day of antibiotics. Start a daily probiotic. Recommend over-the-counter vaginal hydrocortisone (Monistat or vaginocele or some brands). Cool compresses can also be beneficial. Take the Diflucan as directed. If symptoms or not fully resolving with treatment, or you note any new or worsening symptoms despite treatment, please seek an in-person evaluation   If you have been instructed to have an in-person evaluation today at a local Urgent Care facility, please use the link below. It will take you to a list of all of our available [facility], including address, phone number and hours of operation. Please do not delay care.  [facility]  If you or a family member do not have a primary care provider, use the link below to schedule a visit and establish care. When you choose a [facility] primary care physician or advanced practice provider, you gain a long-term partner in health. Find a Primary Care Provider  Learn more about [facility]'s in-office and virtual care options: [facility]

## 2023-03-12 DIAGNOSIS — F930 Separation anxiety disorder of childhood: Secondary | ICD-10-CM | POA: Diagnosis not present

## 2023-03-26 DIAGNOSIS — F930 Separation anxiety disorder of childhood: Secondary | ICD-10-CM | POA: Diagnosis not present

## 2023-04-02 ENCOUNTER — Other Ambulatory Visit: Payer: Self-pay | Admitting: Family Medicine

## 2023-04-02 DIAGNOSIS — F411 Generalized anxiety disorder: Secondary | ICD-10-CM

## 2023-04-02 NOTE — Telephone Encounter (Signed)
Requested medication (s) are due for refill today: Yes  Requested medication (s) are on the active medication list: Yes  Last refill:  02/25/23  Future visit scheduled: No  Notes to clinic:  Left message to call and make appointment.    Requested Prescriptions  Pending Prescriptions Disp Refills   sertraline (ZOLOFT) 100 MG tablet [Pharmacy Med Name: SERTRALINE 100 MG TAB[*]] 60 tablet 0    Sig: TAKE TWO TABLETS BY MOUTH ONE TIME DAILY **PLEASE SCHEDULE OFFICE VISIT BEFORE FUTURE REFILLS**     Psychiatry:  Antidepressants - SSRI - sertraline Failed - 04/02/2023 11:16 AM      Failed - AST in normal range and within 360 days    AST  Date Value Ref Range Status  05/23/2020 15 0 - 40 IU/L Final         Failed - ALT in normal range and within 360 days    ALT  Date Value Ref Range Status  05/23/2020 10 0 - 32 IU/L Final         Passed - Completed PHQ-2 or PHQ-9 in the last 360 days      Passed - Valid encounter within last 6 months    Recent Outpatient Visits           1 month ago Acute streptococcal pharyngitis   [place] Mikey Kirschner, PA-C   11 months ago Acute pharyngitis, unspecified etiology   [place] Mikey Kirschner, PA-C   1 year ago Strep pharyngitis   [facility] Mecum, Dani Gobble, PA-C   1 year ago Pharyngitis due to Streptococcus species   [place] Tally Joe T, FNP   1 year ago GAD (generalized anxiety disorder)   [facility] [facility], Dionne Bucy, MD

## 2023-04-02 NOTE — Telephone Encounter (Signed)
Pt called back reporting that her PCP told her that she did not need an office visit for refills. Pt is requesting a call back from the clinic

## 2023-04-16 DIAGNOSIS — F930 Separation anxiety disorder of childhood: Secondary | ICD-10-CM | POA: Diagnosis not present

## 2023-04-23 DIAGNOSIS — F930 Separation anxiety disorder of childhood: Secondary | ICD-10-CM | POA: Diagnosis not present

## 2023-04-30 ENCOUNTER — Ambulatory Visit: Payer: Federal, State, Local not specified - PPO | Admitting: Physician Assistant

## 2023-04-30 ENCOUNTER — Encounter: Payer: Self-pay | Admitting: Physician Assistant

## 2023-04-30 VITALS — BP 119/78 | Temp 98.7°F | Ht 68.0 in | Wt 136.6 lb

## 2023-04-30 DIAGNOSIS — J029 Acute pharyngitis, unspecified: Secondary | ICD-10-CM

## 2023-04-30 LAB — POCT RAPID STREP A (OFFICE): Rapid Strep A Screen: NEGATIVE

## 2023-04-30 NOTE — Progress Notes (Signed)
I,Sha'taria Tyson,acting as a Neurosurgeon for [employer], PA-C.,have documented all relevant documentation on the behalf of Alfredia Ferguson, PA-C,as directed by  Alfredia Ferguson, PA-C while in the presence of Alfredia Ferguson, PA-C.   Established patient visit   Patient: Rhonda Cole   DOB: Mar 15, 1996   26 y.o. Female  MRN: [id number] Visit Date: 04/30/2023  Today's healthcare provider: Alfredia Ferguson, PA-C   Cc. Sore throat x 2-3 days  Subjective    Sore Throat  This is a new problem. The current episode started in the past 7 days. The problem has been unchanged. There has been no fever. The pain is moderate. Associated symptoms include trouble swallowing. Pertinent negatives include no abdominal pain, coughing, headaches or shortness of breath. Associated symptoms comments: Nausea and loss of appetite. Treatments tried: advil and cough drops. The treatment provided mild relief.    Pt has a history of recurrent strep throat.   Medications: Outpatient Medications Prior to Visit  Medication Sig   fluconazole (DIFLUCAN) 150 MG tablet Take 1 tablet PO once. Repeat in 3 days if needed.   hydrOXYzine (ATARAX) 25 MG tablet take 1-2 tablets by mouth at bedtime if needed for anxiety   norgestimate-ethinyl estradiol (ORTHO-CYCLEN) 0.25-35 MG-MCG tablet Take 1 tablet by mouth daily.   sertraline (ZOLOFT) 100 MG tablet Take 2 tablets (200 mg total) by mouth daily. Please schedule office visit before any future refills   No facility-administered medications prior to visit.    Review of Systems  Constitutional:  Negative for fatigue and fever.  HENT:  Positive for sore throat and trouble swallowing.   Respiratory:  Negative for cough and shortness of breath.   Cardiovascular:  Negative for chest pain and leg swelling.  Gastrointestinal:  Negative for abdominal pain.  Neurological:  Negative for dizziness and headaches.     Objective    BP 119/78 (BP Location: Left Arm, Patient Position:  Sitting, Cuff Size: Normal)   Temp 98.7 F (37.1 C) (Oral)   Ht 5\' 8"  (1.727 m)   Wt 136 lb 9.6 oz (62 kg)   SpO2 100%   BMI 20.77 kg/m    Physical Exam Constitutional:      General: She is awake.     Appearance: She is well-developed.  HENT:     Head: Normocephalic.     Mouth/Throat:     Pharynx: Posterior oropharyngeal erythema present. No oropharyngeal exudate.     Tonsils: 1+ on the right. 1+ on the left.  Eyes:     Conjunctiva/sclera: Conjunctivae normal.  Cardiovascular:     Rate and Rhythm: Normal rate and regular rhythm.     Heart sounds: Normal heart sounds.  Pulmonary:     Effort: Pulmonary effort is normal.     Breath sounds: Normal breath sounds.  Skin:    General: Skin is warm.  Neurological:     Mental Status: She is alert and oriented to person, place, and time.  Psychiatric:        Attention and Perception: Attention normal.        Mood and Affect: Mood normal.        Speech: Speech normal.        Behavior: Behavior is cooperative.      Results for orders placed or performed in visit on 04/30/23  POCT rapid strep A  Result Value Ref Range   Rapid Strep A Screen Negative Negative    Assessment & Plan     1. Acute  pharyngitis, unspecified etiology Viral vs allergic Poc strep negative. Advised salt water gargles, antihistamine. If worsening by next week, contact office.  Return if symptoms worsen or fail to improve.      I, Alfredia Ferguson, PA-C have reviewed all documentation for this visit. The documentation on  04/30/23   for the exam, diagnosis, procedures, and orders are all accurate and complete.  Alfredia Ferguson, PA-C [facility] [address] [place], [place], [address] Office: [phone] Fax: [phone]   [facility]

## 2023-05-14 DIAGNOSIS — F930 Separation anxiety disorder of childhood: Secondary | ICD-10-CM | POA: Diagnosis not present

## 2023-05-17 ENCOUNTER — Other Ambulatory Visit: Payer: Self-pay

## 2023-05-17 ENCOUNTER — Emergency Department
Admission: EM | Admit: 2023-05-17 | Discharge: 2023-05-17 | Disposition: A | Discharge status: Home / Self Care | Payer: Federal, State, Local not specified - PPO | Attending: Emergency Medicine | Admitting: Emergency Medicine

## 2023-05-17 ENCOUNTER — Emergency Department: Payer: Federal, State, Local not specified - PPO

## 2023-05-17 DIAGNOSIS — S51851A Open bite of right forearm, initial encounter: Secondary | ICD-10-CM | POA: Diagnosis not present

## 2023-05-17 DIAGNOSIS — W5321XA Bitten by squirrel, initial encounter: Secondary | ICD-10-CM | POA: Diagnosis not present

## 2023-05-17 DIAGNOSIS — S61051A Open bite of right thumb without damage to nail, initial encounter: Secondary | ICD-10-CM | POA: Diagnosis not present

## 2023-05-17 DIAGNOSIS — S61451A Open bite of right hand, initial encounter: Secondary | ICD-10-CM | POA: Diagnosis not present

## 2023-05-17 MED ORDER — IBUPROFEN 800 MG PO TABS
800.0000 mg | ORAL_TABLET | Freq: Once | ORAL | Status: AC
  Administered 2023-05-17: 800 mg via ORAL
  Filled 2023-05-17: qty 1

## 2023-05-17 MED ORDER — DOXYCYCLINE MONOHYDRATE 100 MG PO TABS: 100.0000 mg | ORAL_TABLET | Freq: Two times a day (BID) | ORAL | 0 refills | Status: DC

## 2023-05-17 NOTE — Discharge Instructions (Addendum)
Keep wound clean and dry.  Use only soap and water.  Apply antibiotic ointment and cover the bite with a clean bandage.  Apply ice to affected area for swelling.  Monitor for infection symptoms as discussed.  If you have been prescribed antibiotics, take them exactly as directed. Do not stop taking them because your symptoms have improved. Take over-the-counter Tylenol or Ibuprofen with food or snacks as needed for pain.Take care and be patient!

## 2023-05-17 NOTE — ED Provider Notes (Signed)
[facility] Emergency Department Provider Note     Event Date/Time   First MD Initiated Contact with Patient 05/17/23 1140     (approximate)   History   Animal Bite   HPI  Rhonda Cole is a 27 y.o. female who presents to the ED for a squirrel bite. Patient reports squirrel was caught by its leg in her bird feeder and she tried to release it causing the squirrel to bite and scratch her multiple times on her hands and forearm. She endorses throbbing pain to the tip of her right index finger.  She denies numbness, tingling and loss of mobility.  Tetanus is up-to-date.  Patient reports the squirrel did not show abnormal behavior.  No other complaints at this time.     Physical Exam   Triage Vital Signs: ED Triage Vitals  Enc Vitals Group     BP 05/17/23 1135 (!) 125/96     Pulse Rate 05/17/23 1135 94     Resp 05/17/23 1135 16     Temp 05/17/23 1135 98.6 F (37 C)     Temp Source 05/17/23 1135 Oral     SpO2 05/17/23 1135 97 %     Weight 05/17/23 1139 136 lb 7.4 oz (61.9 kg)     Height 05/17/23 1139 5\' 8"  (1.727 m)     Head Circumference --      Peak Flow --      Pain Score 05/17/23 1133 8     Pain Loc --      Pain Edu? --      Excl. in GC? --     Most recent vital signs: Vitals:   05/17/23 1135  BP: (!) 125/96  Pulse: 94  Resp: 16  Temp: 98.6 F (37 C)  SpO2: 97%   General: Well appearing. Alert. INAD.  Skin:  Warm, dry and intact. No rashes or lesions noted.     Head:  NCAT.  Eyes:  PERRLA. EOMI. Conjunctivae clear. Neck:   No lymphadenopathy.  CV:  Good peripheral perfusion. RRR.  RESP:  Normal effort. LCTAB.  ABD:  No distention.  OTHER: Several superficial bite lesions and erythremic excoriations on bilateral hands and forearm.  ~1cm superficial open puncture wound on distal volar aspect of right index phalanx.  Tenderness to distal phalanx upon palpation.  Bleeding controlled.  Neurovascular status intact.  Full active range of  motion limited due to pain.  No tenderness along collateral ligament.     ED Results / Procedures / Treatments   Labs (all labs ordered are listed, but only abnormal results are displayed) Labs Reviewed - No data to display  RADIOLOGY  I personally viewed and evaluated these images as part of my medical decision making, as well as reviewing the written report by the radiologist.  ED Provider Interpretation: Right hand x-ray reveals no evidence of acute bony abnormality or retained foreign body.    DG Hand Complete Right  Result Date: 05/17/2023 CLINICAL DATA:  Squirrel bite EXAM: RIGHT HAND - COMPLETE 3+ VIEW COMPARISON:  None Available. FINDINGS: No acute fracture or dislocation.  No radiopaque foreign object. IMPRESSION: No acute osseous abnormality. Electronically Signed   By: Jeronimo Greaves M.D.   On: 05/17/2023 14:48    PROCEDURES:  Critical Care performed: No  Procedures   MEDICATIONS ORDERED IN ED: Medications  ibuprofen (ADVIL) tablet 800 mg (800 mg Oral Given 05/17/23 1212)    IMPRESSION / MDM / ASSESSMENT AND PLAN /  ED COURSE  I reviewed the triage vital signs and the nursing notes.                              Differential diagnosis includes, but is not limited to, animal bite, laceration, tendon injury  Patient's presentation is most consistent with acute, uncomplicated illness.  27 year old female presents to the emergency department for evaluation and treatment of a provoked squirrel bite to the tip of her right index finger and multiple scratches on both hands and left forearm.  See HPI for further details.  I performed liberal cleaning of the bite wound at the injury site with soap and water and then used high-pressure irrigation with saline.  I instructed the patient to keep her hand elevated. I will give ibuprofen for pain and ice for swelling.  Hand x-ray is reassuring. There is no acute bony abnormalities or retained foreign bodies.  Low suspicion of tendon  injury. I discussed with Dr. Fuller Plan of recommendations for further management and treatment plan. .  We discussed with the patient options of possible rabies vaccine series, although there is low risk of rabies presented in [place] squirrels.  Patient opt not to receive rabies vaccine at this time and to closely monitor symptoms and signs of infection discussed. Patient's diagnosis is consistent with a squirrel bite. Patient will be discharged home with prescription of doxycycline for 14 days. Patient is to follow up with primary care as needed or otherwise directed. Patient is instructed to return if she changes her mind of rabies vaccination. Patient is given ED precautions to return to the ED for any worsening or new symptoms.  Patient verbalizes understanding and agreement to this assessment and plan.  Clinical Course as of 05/17/23 1451  Sun May 17, 2023  [address] [place] communicable disease branch contacted for recommendations on rabies vaccination needs.  No answer. [MH]    Clinical Course User Index [MH] Romeo Apple, Laverta Harnisch A, PA-C    FINAL CLINICAL IMPRESSION(S) / ED DIAGNOSES   Final diagnoses:  Bitten by squirrel, initial encounter     Rx / DC Orders   ED Discharge Orders          Ordered    doxycycline (ADOXA) 100 MG tablet  2 times daily        05/17/23 1441             Note:  This document was prepared using Dragon voice recognition software and may include unintentional dictation errors.    Romeo Apple, Michelena Culmer A, PA-C 05/17/23 1527    Concha Se, MD 05/20/23 [phone]

## 2023-05-17 NOTE — ED Notes (Signed)
See triage note  Presents with animal bite to right thumb area  States she was helping a squirrel that was stuck on bird feeder

## 2023-05-17 NOTE — ED Triage Notes (Signed)
Pt presents via POV c/o being bitten by squirrel. Bleeding controled at this time.

## 2023-05-20 ENCOUNTER — Telehealth: Payer: Self-pay

## 2023-05-20 ENCOUNTER — Ambulatory Visit: Payer: Federal, State, Local not specified - PPO | Admitting: Physician Assistant

## 2023-05-20 ENCOUNTER — Encounter: Payer: Self-pay | Admitting: Physician Assistant

## 2023-05-20 VITALS — BP 119/85 | HR 79 | Temp 98.6°F | Resp 14 | Ht 68.0 in | Wt 135.7 lb

## 2023-05-20 DIAGNOSIS — T148XXA Other injury of unspecified body region, initial encounter: Secondary | ICD-10-CM

## 2023-05-20 DIAGNOSIS — F930 Separation anxiety disorder of childhood: Secondary | ICD-10-CM | POA: Diagnosis not present

## 2023-05-20 NOTE — Progress Notes (Signed)
    I,Vanessa  Vital,acting as a Neurosurgeon for [employer], PA-C.,have documented all relevant documentation on the behalf of Alfredia Ferguson, PA-C,as directed by  Alfredia Ferguson, PA-C while in the presence of Alfredia Ferguson, PA-C.    Established patient visit   Patient: Rhonda Cole   DOB: 09-Sep-1996   26 y.o. Female  MRN: [id number] Visit Date: 05/20/2023  Today's healthcare provider: Alfredia Ferguson, PA-C   Cc. ED f/u  Subjective    HPI  Follow up ED Visit  Patient was seen in the ED 05/17/23 for a squirrel bite. She was given doxycycline and had an xray of her right hand without abnormalities. She reports good compliance with treatment. She reports this condition is improved.  ----------------------------------------------------------------------------------------- -  Medications: Outpatient Medications Prior to Visit  Medication Sig   doxycycline (ADOXA) 100 MG tablet Take 1 tablet (100 mg total) by mouth 2 (two) times daily.   hydrOXYzine (ATARAX) 25 MG tablet take 1-2 tablets by mouth at bedtime if needed for anxiety   norgestimate-ethinyl estradiol (ORTHO-CYCLEN) 0.25-35 MG-MCG tablet Take 1 tablet by mouth daily.   sertraline (ZOLOFT) 100 MG tablet Take 2 tablets (200 mg total) by mouth daily. Please schedule office visit before any future refills   fluconazole (DIFLUCAN) 150 MG tablet Take 1 tablet PO once. Repeat in 3 days if needed.   No facility-administered medications prior to visit.    Review of Systems  Constitutional:  Negative for fatigue and fever.  Respiratory:  Negative for cough and shortness of breath.   Cardiovascular:  Negative for chest pain and leg swelling.  Gastrointestinal:  Negative for abdominal pain.  Skin:  Positive for wound.  Neurological:  Negative for dizziness and headaches.      Objective    BP 119/85 (BP Location: Left Arm, Patient Position: Sitting, Cuff Size: Normal)   Pulse 79   Temp 98.6 F (37 C) (Oral)   Resp 14   Ht 5'  8" (1.727 m)   Wt 135 lb 11.2 oz (61.6 kg)   LMP 05/11/2023 (Exact Date)   SpO2 100%   BMI 20.63 kg/m   Physical Exam Skin:    Comments: R ring finger w/ healing puncture wound  B/l hands w/ abrasions       No results found for any visits on 05/20/23.  Assessment & Plan     1. Animal bite Pt was advised rabies is not likely and no need to get vaccine Advised she call animal control for their opinion as well Wound healing appropriately no signs of infection  I, Alfredia Ferguson, PA-C have reviewed all documentation for this visit. The documentation on  05/20/23   for the exam, diagnosis, procedures, and orders are all accurate and complete.  Alfredia Ferguson, PA-C [facility] [address] [place], [place], [address] Office: [phone] Fax: [phone]   [facility]

## 2023-05-20 NOTE — Transitions of Care (Post Inpatient/ED Visit) (Signed)
05/20/2023  Name: Rhonda Cole MRN: [id number] DOB: 06/12/1996  Today's TOC FU Call Status: Today's TOC FU Call Status:: Successful TOC FU Call Competed TOC FU Call Complete Date: 05/20/23  Red on EMMI-ED Discharge Alert Date & Reason:05/19/23 "Scheduled follow-up appt? No"   Transition Care Management Follow-up Telephone Call Date of Discharge: 05/17/23 Discharge Facility: [facility]) Type of Discharge: Emergency Department Reason for ED Visit: Other: ("bitten by squirrel") How have you been since you were released from the hospital?: Better (Pt states affected area 'looking good-swelling has gone down,still has numbness but starting to get a little feeling back to area." She voices pain is controlled-has not taken any pain meds(Tylenol)in a few days. She is taking abx as ordered.) Any questions or concerns?: No  Items Reviewed: Did you receive and understand the discharge instructions provided?: Yes Medications obtained,verified, and reconciled?: Yes (Medications Reviewed) Any new allergies since your discharge?: No Dietary orders reviewed?: NA Do you have support at home?: Yes People in Home: parent(s) Name of Support/Comfort Primary Source: mom and dad  Medications Reviewed Today: Medications Reviewed Today     Reviewed by Charlyn Minerva, RN (Registered Nurse) on 05/20/23 at 1209  Med List Status: <None>   Medication Order Taking? Sig Documenting Provider Last Dose Status Informant  doxycycline (ADOXA) 100 MG tablet [id number] Yes Take 1 tablet (100 mg total) by mouth 2 (two) times daily. Romeo Apple, Myah A, PA-C Taking Active   fluconazole (DIFLUCAN) 150 MG tablet [id number]  Take 1 tablet PO once. Repeat in 3 days if needed. Waldon Merl, PA-C  Active   hydrOXYzine (ATARAX) 25 MG tablet [id number] Yes take 1-2 tablets by mouth at bedtime if needed for anxiety Alfredia Ferguson, PA-C Taking Active   norgestimate-ethinyl estradiol  (ORTHO-CYCLEN) 0.25-35 MG-MCG tablet [id number] Yes Take 1 tablet by mouth daily. Ellwood Sayers, CNM Taking Active   sertraline (ZOLOFT) 100 MG tablet [id number] Yes Take 2 tablets (200 mg total) by mouth daily. Please schedule office visit before any future refills Drubel, Lillia Abed, PA-C Taking Active             Home Care and Equipment/Supplies: Were Home Health Services Ordered?: NA Any new equipment or medical supplies ordered?: NA  Functional Questionnaire: Do you need assistance with bathing/showering or dressing?: No Do you need assistance with meal preparation?: No Do you have difficulty maintaining continence: No Do you need assistance with getting out of bed/getting out of a chair/moving?: No  Follow up appointments reviewed: PCP Follow-up appointment confirmed?: Yes Date of PCP follow-up appointment?: 05/20/23 Follow-up Provider: Dyann Ruddle Specialist [facility] Follow-up appointment confirmed?: NA Do you need transportation to your follow-up appointment?: No Do you understand care options if your condition(s) worsen?: Yes-patient verbalized understanding  SDOH Interventions Today    Flowsheet Row Most Recent Value  SDOH Interventions   Food Insecurity Interventions Intervention Not Indicated  Transportation Interventions Intervention Not Indicated       TOC Interventions Today    Flowsheet Row Most Recent Value  TOC Interventions   TOC Interventions Discussed/Reviewed TOC Interventions Discussed, S/S of infection, Post op wound/incision care      Interventions Today    Flowsheet Row Most Recent Value  General Interventions   General Interventions Discussed/Reviewed General Interventions Discussed, Doctor Visits  Doctor Visits Discussed/Reviewed Doctor Visits Discussed, PCP  PCP/Specialist Visits Compliance with follow-up visit  Education Interventions   Education Provided Provided Education  Provided Verbal Education On Medication, When to  see the  doctor  Pharmacy Interventions   Pharmacy Dicussed/Reviewed Pharmacy Topics Discussed, Medications and their functions  Safety Interventions   Safety Discussed/Reviewed Safety Discussed        [facility] Care Management Care Management Community Coordinator Direct Phone: [phone] Toll Free: [phone] Fax: [phone]

## 2023-05-27 DIAGNOSIS — F930 Separation anxiety disorder of childhood: Secondary | ICD-10-CM | POA: Diagnosis not present

## 2023-06-04 DIAGNOSIS — F930 Separation anxiety disorder of childhood: Secondary | ICD-10-CM | POA: Diagnosis not present

## 2023-06-08 DIAGNOSIS — F930 Separation anxiety disorder of childhood: Secondary | ICD-10-CM | POA: Diagnosis not present

## 2023-06-17 DIAGNOSIS — F930 Separation anxiety disorder of childhood: Secondary | ICD-10-CM | POA: Diagnosis not present

## 2023-06-23 DIAGNOSIS — F930 Separation anxiety disorder of childhood: Secondary | ICD-10-CM | POA: Diagnosis not present

## 2023-06-26 ENCOUNTER — Telehealth: Payer: Federal, State, Local not specified - PPO | Admitting: Physician Assistant

## 2023-06-26 DIAGNOSIS — B3731 Acute candidiasis of vulva and vagina: Secondary | ICD-10-CM

## 2023-06-26 MED ORDER — FLUCONAZOLE 150 MG PO TABS
ORAL_TABLET | ORAL | 0 refills | Status: DC
Start: 2023-06-26 — End: 2023-09-03

## 2023-06-26 NOTE — Progress Notes (Signed)
I have spent 5 minutes in review of e-visit questionnaire, review and updating patient chart, medical decision making and response to patient.   Eriyah Fernando Cody Haven Foss, PA-C    

## 2023-06-26 NOTE — Progress Notes (Signed)

## 2023-06-30 DIAGNOSIS — F930 Separation anxiety disorder of childhood: Secondary | ICD-10-CM | POA: Diagnosis not present

## 2023-07-16 DIAGNOSIS — F930 Separation anxiety disorder of childhood: Secondary | ICD-10-CM | POA: Diagnosis not present

## 2023-07-21 DIAGNOSIS — F930 Separation anxiety disorder of childhood: Secondary | ICD-10-CM | POA: Diagnosis not present

## 2023-08-05 DIAGNOSIS — F930 Separation anxiety disorder of childhood: Secondary | ICD-10-CM | POA: Diagnosis not present

## 2023-08-12 DIAGNOSIS — F930 Separation anxiety disorder of childhood: Secondary | ICD-10-CM | POA: Diagnosis not present

## 2023-08-24 DIAGNOSIS — F930 Separation anxiety disorder of childhood: Secondary | ICD-10-CM | POA: Diagnosis not present

## 2023-09-02 DIAGNOSIS — F930 Separation anxiety disorder of childhood: Secondary | ICD-10-CM | POA: Diagnosis not present

## 2023-09-03 ENCOUNTER — Encounter: Payer: Self-pay | Admitting: Family Medicine

## 2023-09-03 ENCOUNTER — Ambulatory Visit: Payer: Federal, State, Local not specified - PPO | Admitting: Family Medicine

## 2023-09-03 VITALS — BP 118/84 | HR 90 | Temp 97.8°F | Resp 12 | Ht 68.0 in | Wt 138.5 lb

## 2023-09-03 DIAGNOSIS — G43831 Menstrual migraine, intractable, with status migrainosus: Secondary | ICD-10-CM

## 2023-09-03 MED ORDER — ONDANSETRON HCL 4 MG PO TABS: 4.0000 mg | ORAL_TABLET | Freq: Three times a day (TID) | ORAL | 1 refills | Status: DC | PRN

## 2023-09-03 MED ORDER — DIPHENHYDRAMINE HCL 25 MG PO CAPS
25.0000 mg | ORAL_CAPSULE | Freq: Once | ORAL | Status: AC
Start: 2023-09-03 — End: 2023-09-03
  Administered 2023-09-03: 25 mg via ORAL

## 2023-09-03 MED ORDER — PROMETHAZINE HCL 50 MG/ML IJ SOLN
25.0000 mg | Freq: Once | INTRAMUSCULAR | Status: AC
Start: 2023-09-03 — End: 2023-09-03
  Administered 2023-09-03: 25 mg via INTRAMUSCULAR

## 2023-09-03 MED ORDER — KETOROLAC TROMETHAMINE 60 MG/2ML IM SOLN
60.0000 mg | Freq: Once | INTRAMUSCULAR | Status: AC
Start: 2023-09-03 — End: 2023-09-03
  Administered 2023-09-03: 60 mg via INTRAMUSCULAR

## 2023-09-03 MED ORDER — PROMETHAZINE HCL 25 MG/ML IJ SOLN: 25.0000 mg | Freq: Once | INTRAMUSCULAR | 0 refills | Status: DC

## 2023-09-03 MED ORDER — SUMATRIPTAN SUCCINATE 100 MG PO TABS: 50.0000 mg | ORAL_TABLET | ORAL | 5 refills | Status: DC | PRN

## 2023-09-03 NOTE — Patient Instructions (Signed)
take 1 pill early on when you suspect a migraine attack coming on. You may take another pill within 2 hours, no more than 2 pills in 24 hours.   Most people who take triptans do not have any serious side-effects. However, they can cause drowsiness (remember to not drive or use heavy machinery when drowsy), nausea, dizziness, dry mouth.   Less common side effects include strange sensations, such as tightness in your chest or throat, tingling, flushing, and feelings of heaviness or pressure in areas such as the face, limbs, and chest. These in the chest can mimic heart related pain (angina) and may cause alarm, but usually these sensations are not harmful or a sign of a heart attack. However, if you develop intense chest pain or sensations of discomfort, you should stop taking your medication and consult with me or go to the nearest urgent care facility or ER or call 911.

## 2023-09-03 NOTE — Progress Notes (Signed)
Acute Office Visit  Subjective:     Patient ID: Rhonda Cole, female    DOB: [birth year], 27 y.o.   MRN: [id number]  No chief complaint on file.   HPI Discussed the use of AI scribe software for clinical note transcription with the patient, who gave verbal consent to proceed.  History of Present Illness   The patient, with a history of migraines, presents with a severe, persistent migraine that has lasted almost a week. The pain is located on the left side and extends down into the jaw. Over-the-counter medications, including Excedrin, Tylenol, and ibuprofen, have not provided relief. The patient also reports associated symptoms of nausea, dizziness, and shakiness. The patient suspects that their migraines may be related to their menstrual cycle. This current episode is the worst and longest the patient has experienced, as usually Tylenol is sufficient to alleviate the pain. The patient has previously taken Sumatriptan, a prescription medication for migraines, with some relief.       ROS per HPI      Objective:    BP 118/84 (BP Location: Left Arm, Patient Position: Sitting, Cuff Size: Normal)   Pulse 90   Temp 97.8 F (36.6 C) (Temporal)   Resp 12   Ht 5\' 8"  (1.727 m)   Wt 138 lb 8 oz (62.8 kg)   SpO2 99%   BMI 21.06 kg/m    Physical Exam Vitals reviewed.  Constitutional:      General: She is not in acute distress.    Appearance: She is well-developed.  HENT:     Head: Normocephalic and atraumatic.  Eyes:     General: No scleral icterus.    Conjunctiva/sclera: Conjunctivae normal.  Cardiovascular:     Rate and Rhythm: Normal rate and regular rhythm.  Pulmonary:     Effort: Pulmonary effort is normal. No respiratory distress.  Skin:    General: Skin is warm and dry.     Findings: No rash.  Neurological:     Mental Status: She is alert and oriented to person, place, and time.  Psychiatric:        Behavior: Behavior normal.     No results found for any  visits on 09/03/23.      Assessment & Plan:   Problem List Items Addressed This Visit   None Visit Diagnoses     Intractable menstrual migraine with status migrainosus    -  Primary   Relevant Medications   SUMAtriptan (IMITREX) 100 MG tablet           Intractable Migraine Severe, persistent left-sided headache with associated nausea and dizziness, unresponsive to over-the-counter analgesics. Suspected menstrual cycle relation. Prior positive response to Sumatriptan. -Administer "migraine cocktail" consisting of Toradol, Benadryl, and Phenergan in the office today to break the migraine cycle. Patient to arrange for pick-up due to sedative effects of the medication. She will return after work today to receive medications -Prescribe Sumatriptan for future migraines, to be taken at the first sign of a migraine. Limit of two doses in a day and three doses in a week. -Prescribe Zofran for potential nausea associated with migraines or Sumatriptan use.  General Health Maintenance -Recommend scheduling a physical examination as it has been a significant amount of time since the last one.        Meds ordered this encounter  Medications   SUMAtriptan (IMITREX) 100 MG tablet    Sig: Take 0.5-1 tablets (50-100 mg total) by mouth every 2 (two) hours as needed for  migraine. May repeat in 2 hours if headache persists or recurs.    Dispense:  10 tablet    Refill:  5   ondansetron (ZOFRAN) 4 MG tablet    Sig: Take 1 tablet (4 mg total) by mouth every 8 (eight) hours as needed for nausea or vomiting.    Dispense:  20 tablet    Refill:  1    Return for CPE.  Shirlee Latch, MD

## 2023-09-03 NOTE — Addendum Note (Signed)
Addended by: Hyacinth Meeker on: 09/03/2023 04:10 PM   Modules accepted: Orders

## 2023-09-08 DIAGNOSIS — F930 Separation anxiety disorder of childhood: Secondary | ICD-10-CM | POA: Diagnosis not present

## 2023-09-08 MED ORDER — PREDNISONE 10 MG PO TABS: ORAL_TABLET | ORAL | 0 refills | Status: DC

## 2023-09-21 ENCOUNTER — Telehealth: Payer: Self-pay | Admitting: Family Medicine

## 2023-09-21 ENCOUNTER — Other Ambulatory Visit: Payer: Self-pay

## 2023-09-21 DIAGNOSIS — F411 Generalized anxiety disorder: Secondary | ICD-10-CM

## 2023-09-21 MED ORDER — SERTRALINE HCL 100 MG PO TABS
200.0000 mg | ORAL_TABLET | Freq: Every day | ORAL | 1 refills | Status: DC
Start: 2023-09-21 — End: 2023-09-22

## 2023-09-21 NOTE — Telephone Encounter (Signed)
refilled 

## 2023-09-21 NOTE — Telephone Encounter (Signed)
[facility] is requesting prescription refill sertraline (ZOLOFT) 100 MG tablet  Please advise

## 2023-09-22 ENCOUNTER — Other Ambulatory Visit: Payer: Self-pay | Admitting: Family Medicine

## 2023-09-22 DIAGNOSIS — F411 Generalized anxiety disorder: Secondary | ICD-10-CM

## 2023-09-22 MED ORDER — SERTRALINE HCL 100 MG PO TABS
200.0000 mg | ORAL_TABLET | Freq: Every day | ORAL | 0 refills | Status: DC
Start: 2023-09-22 — End: 2023-10-20

## 2023-09-29 DIAGNOSIS — F930 Separation anxiety disorder of childhood: Secondary | ICD-10-CM | POA: Diagnosis not present

## 2023-10-06 DIAGNOSIS — F930 Separation anxiety disorder of childhood: Secondary | ICD-10-CM | POA: Diagnosis not present

## 2023-10-13 DIAGNOSIS — F930 Separation anxiety disorder of childhood: Secondary | ICD-10-CM | POA: Diagnosis not present

## 2023-10-15 ENCOUNTER — Encounter: Payer: Self-pay | Admitting: Family Medicine

## 2023-10-20 ENCOUNTER — Ambulatory Visit: Payer: Federal, State, Local not specified - PPO | Admitting: Family Medicine

## 2023-10-20 ENCOUNTER — Encounter: Payer: Self-pay | Admitting: Family Medicine

## 2023-10-20 VITALS — BP 112/80 | HR 90 | Temp 98.3°F | Ht 68.0 in | Wt 142.0 lb

## 2023-10-20 DIAGNOSIS — F411 Generalized anxiety disorder: Secondary | ICD-10-CM

## 2023-10-20 DIAGNOSIS — Z23 Encounter for immunization: Secondary | ICD-10-CM | POA: Diagnosis not present

## 2023-10-20 DIAGNOSIS — Z0001 Encounter for general adult medical examination with abnormal findings: Secondary | ICD-10-CM

## 2023-10-20 DIAGNOSIS — G43009 Migraine without aura, not intractable, without status migrainosus: Secondary | ICD-10-CM | POA: Diagnosis not present

## 2023-10-20 DIAGNOSIS — F930 Separation anxiety disorder of childhood: Secondary | ICD-10-CM | POA: Diagnosis not present

## 2023-10-20 DIAGNOSIS — Z0000 Encounter for general adult medical examination without abnormal findings: Secondary | ICD-10-CM

## 2023-10-20 MED ORDER — SERTRALINE HCL 100 MG PO TABS: 200.0000 mg | ORAL_TABLET | Freq: Every day | ORAL | 3 refills | Status: DC

## 2023-10-20 NOTE — Progress Notes (Signed)
Complete physical exam  Patient: Rhonda Cole   DOB: [birth year]   27 y.o. Female  MRN: [id number]  Subjective:    Chief Complaint  Patient presents with   Annual Exam    Rhonda Cole is a 27 y.o. female who presents today for a complete physical exam. She reports consuming a general diet.  She generally feels well. She reports sleeping well. She does not have additional problems to discuss today.    Discussed the use of AI scribe software for clinical note transcription with the patient, who gave verbal consent to proceed.  History of Present Illness   The patient, who recently changed jobs, reports a significant reduction in headaches since leaving her previous employment. She is currently on Zoloft and sumatriptan, with sufficient refills for both. She received a flu shot three weeks ago. She has no other concerns or symptoms to report.       Most recent fall risk assessment:    10/20/2023   10:52 AM  Fall Risk   Falls in the past year? 0  Number falls in past yr: 0  Injury with Fall? 0     Most recent depression screenings:    10/20/2023   10:52 AM 09/03/2023    8:07 AM  PHQ 2/9 Scores  PHQ - 2 Score 0 0  PHQ- 9 Score 2 4        Patient Care Team: Erasmo Downer, MD as PCP - General (Family Medicine)   Outpatient Medications Prior to Visit  Medication Sig   hydrOXYzine (ATARAX) 25 MG tablet take 1-2 tablets by mouth at bedtime if needed for anxiety   norgestimate-ethinyl estradiol (ORTHO-CYCLEN) 0.25-35 MG-MCG tablet Take 1 tablet by mouth daily.   ondansetron (ZOFRAN) 4 MG tablet Take 1 tablet (4 mg total) by mouth every 8 (eight) hours as needed for nausea or vomiting.   SUMAtriptan (IMITREX) 100 MG tablet Take 0.5-1 tablets (50-100 mg total) by mouth every 2 (two) hours as needed for migraine. May repeat in 2 hours if headache persists or recurs.   [DISCONTINUED] sertraline (ZOLOFT) 100 MG tablet Take 2 tablets (200 mg total) by mouth daily.    [DISCONTINUED] predniSONE (DELTASONE) 10 MG tablet Take 60mg  PO daily x1d, then 50mg  daily x1d, then 40mg  daily x1d, then 30mg  daily x1d, then 20mg  daily x1d, then 10mg  daily x1d, then stop   No facility-administered medications prior to visit.    ROS        Objective:     BP 112/80 (BP Location: Left Arm, Patient Position: Sitting, Cuff Size: Normal)   Pulse 90   Temp 98.3 F (36.8 C) (Oral)   Ht 5\' 8"  (1.727 m)   Wt 142 lb (64.4 kg)   SpO2 99%   BMI 21.59 kg/m    Physical Exam Vitals reviewed.  Constitutional:      General: She is not in acute distress.    Appearance: Normal appearance. She is well-developed. She is not diaphoretic.  HENT:     Head: Normocephalic and atraumatic.     Right Ear: Tympanic membrane, ear canal and external ear normal.     Left Ear: Tympanic membrane, ear canal and external ear normal.     Nose: Nose normal.     Mouth/Throat:     Mouth: Mucous membranes are moist.     Pharynx: Oropharynx is clear. No oropharyngeal exudate.  Eyes:     General: No scleral icterus.    Conjunctiva/sclera: Conjunctivae normal.  Pupils: Pupils are equal, round, and reactive to light.  Neck:     Thyroid: No thyromegaly.  Cardiovascular:     Rate and Rhythm: Normal rate and regular rhythm.     Heart sounds: Normal heart sounds. No murmur heard. Pulmonary:     Effort: Pulmonary effort is normal. No respiratory distress.     Breath sounds: Normal breath sounds. No wheezing or rales.  Abdominal:     General: There is no distension.     Palpations: Abdomen is soft.     Tenderness: There is no abdominal tenderness.  Musculoskeletal:        General: No deformity.     Cervical back: Neck supple.     Right lower leg: No edema.     Left lower leg: No edema.  Lymphadenopathy:     Cervical: No cervical adenopathy.  Skin:    General: Skin is warm and dry.     Findings: No rash.  Neurological:     Mental Status: She is alert and oriented to person, place,  and time. Mental status is at baseline.     Gait: Gait normal.  Psychiatric:        Mood and Affect: Mood normal.        Behavior: Behavior normal.        Thought Content: Thought content normal.      No results found for any visits on 10/20/23.     Assessment & Plan:    Routine Health Maintenance and Physical Exam  Immunization History  Administered Date(s) Administered   DTaP 09/11/1998, 11/07/1998, 01/09/1999, 01/10/2000, 08/01/2003   HIB (PRP-OMP) 09/11/1998, 11/07/1998, 01/09/1999, 01/10/2000   HPV 9-valent 10/20/2023   Hepatitis A 07/10/2009, 02/06/2011   Hepatitis B 09/11/1998, 11/07/1998, 04/18/1999   IPV 09/11/1998, 11/07/1998, 01/09/1999, 08/01/2003   Influenza-Unspecified 12/01/2017, 10/23/2021, 09/29/2023   MMR 07/09/1999, 08/01/2003   Meningococcal Conjugate 02/06/2011, 06/13/2015   PFIZER(Purple Top)SARS-COV-2 Vaccination 02/09/2020, 03/05/2020   PPD Test 05/30/2022   Pneumococcal-Unspecified 10/16/1999, 01/10/2000   Td 07/10/2009   Tdap 07/10/2009, 12/01/2017   Varicella 07/09/1999, 07/10/2009    Health Maintenance  Topic Date Due   HIV Screening  Never done   Hepatitis C Screening  Never done   COVID-19 Vaccine (3 - 2023-24 season) 08/30/2023   HPV VACCINES (2 - 3-dose series) 11/17/2023   Cervical Cancer Screening (Pap smear)  01/16/2026   DTaP/Tdap/Td (9 - Td or Tdap) 12/02/2027   INFLUENZA VACCINE  Completed    Discussed health benefits of physical activity, and encouraged her to engage in regular exercise appropriate for her age and condition.  Problem List Items Addressed This Visit       Cardiovascular and Mediastinum   Migraine without aura and without status migrainosus, not intractable   Relevant Medications   sertraline (ZOLOFT) 100 MG tablet     Other   GAD (generalized anxiety disorder)   Relevant Medications   sertraline (ZOLOFT) 100 MG tablet   Other Visit Diagnoses     Encounter for annual physical exam    -  Primary    Immunization due       Relevant Orders   HPV 9-valent vaccine,Recombinat (Completed)   Need for HPV vaccine       Relevant Orders   HPV 9-valent vaccine,Recombinat (Completed)           General Health Maintenance Up to date on Pap smear (last in January 2024, next due in 2027). Received flu shot approximately three weeks ago. Patient  has not yet received HPV vaccine. -Administer first dose of HPV vaccine today, with follow-up doses in 1-2 months and 6 months.  Migraines Patient reports improvement in headaches since quitting job. -Continue current management with Sumatriptan as needed. Patient has sufficient refills.   GAD Well controlled -Continue current management with Zoloft 200mg  daily. Refill prescription for the next year.  Follow-up Annual physical due in one year.        Return in about 1 year (around 10/19/2024) for CPE - also Nurse visit for HPV shot in 1-2 months and in 6 months.     Shirlee Latch, MD

## 2023-10-21 ENCOUNTER — Other Ambulatory Visit: Payer: Self-pay | Admitting: Licensed Practical Nurse

## 2023-10-21 DIAGNOSIS — Z0000 Encounter for general adult medical examination without abnormal findings: Secondary | ICD-10-CM

## 2023-10-27 DIAGNOSIS — F930 Separation anxiety disorder of childhood: Secondary | ICD-10-CM | POA: Diagnosis not present

## 2023-11-04 DIAGNOSIS — F930 Separation anxiety disorder of childhood: Secondary | ICD-10-CM | POA: Diagnosis not present

## 2023-11-16 DIAGNOSIS — F930 Separation anxiety disorder of childhood: Secondary | ICD-10-CM | POA: Diagnosis not present

## 2023-12-02 DIAGNOSIS — F930 Separation anxiety disorder of childhood: Secondary | ICD-10-CM | POA: Diagnosis not present

## 2023-12-10 DIAGNOSIS — F930 Separation anxiety disorder of childhood: Secondary | ICD-10-CM | POA: Diagnosis not present

## 2023-12-14 DIAGNOSIS — F930 Separation anxiety disorder of childhood: Secondary | ICD-10-CM | POA: Diagnosis not present

## 2023-12-17 ENCOUNTER — Encounter: Payer: Self-pay | Admitting: Family Medicine

## 2023-12-17 ENCOUNTER — Ambulatory Visit: Payer: Federal, State, Local not specified - PPO | Admitting: Family Medicine

## 2023-12-17 DIAGNOSIS — Z23 Encounter for immunization: Secondary | ICD-10-CM

## 2023-12-17 NOTE — Progress Notes (Signed)
Gardasil administered in LD using 25G 1" needle. Patient tolerated well.  Patient here for HPV vaccination only.  I did not examine the patient.  I did review her medical history, medications, and allergies and vaccine consent form.  CMA gave vaccination. Patient tolerated well.  Erasmo Downer, MD, MPH [facility] 12/17/2023 1:20 PM

## 2023-12-28 ENCOUNTER — Encounter: Payer: Self-pay | Admitting: Family Medicine

## 2023-12-29 DIAGNOSIS — F930 Separation anxiety disorder of childhood: Secondary | ICD-10-CM | POA: Diagnosis not present

## 2023-12-29 NOTE — Telephone Encounter (Signed)
Recommend visit to discuss options - can be in person or virtual

## 2024-01-04 ENCOUNTER — Ambulatory Visit: Payer: Federal, State, Local not specified - PPO | Admitting: Family Medicine

## 2024-01-04 ENCOUNTER — Encounter: Payer: Self-pay | Admitting: Family Medicine

## 2024-01-04 VITALS — BP 118/78 | HR 55 | Temp 99.1°F | Ht 68.0 in | Wt 144.0 lb

## 2024-01-04 DIAGNOSIS — R5382 Chronic fatigue, unspecified: Secondary | ICD-10-CM | POA: Diagnosis not present

## 2024-01-04 DIAGNOSIS — G4489 Other headache syndrome: Secondary | ICD-10-CM

## 2024-01-04 DIAGNOSIS — G43009 Migraine without aura, not intractable, without status migrainosus: Secondary | ICD-10-CM | POA: Diagnosis not present

## 2024-01-04 DIAGNOSIS — G478 Other sleep disorders: Secondary | ICD-10-CM

## 2024-01-04 MED ORDER — METHOCARBAMOL 500 MG PO TABS: 500.0000 mg | ORAL_TABLET | Freq: Three times a day (TID) | ORAL | 1 refills | Status: DC | PRN

## 2024-01-04 MED ORDER — RIZATRIPTAN BENZOATE 10 MG PO TABS: 10.0000 mg | ORAL_TABLET | ORAL | 1 refills | Status: DC | PRN

## 2024-01-04 MED ORDER — NORTRIPTYLINE HCL 10 MG PO CAPS: 10.0000 mg | ORAL_CAPSULE | Freq: Every day | ORAL | 1 refills | Status: DC

## 2024-01-04 NOTE — Assessment & Plan Note (Signed)
 Chronic migraines and other headaches occurring daily except for five days a month, with increased frequency around menstruation. 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  No repeat imaging needed at this time.  Sumatriptan  is ineffective. Symptoms include dizziness, nausea, and jaw pain. Considering a mixed headache picture with both migraine and tension-type components. Discussed threefold approach: prevention, immediate treatment, and abortive therapy. Nortriptyline  considered due to family history of efficacy. Rizatriptan  suggested as an alternative to sumatriptan . Methocarbamol  noted effective for severe episodes. Insurance may require trial of two triptans before approving Nurtec. Nortriptyline  benefits include headache prevention and sleep improvement.   - Prescribe nortriptyline  10 mg at bedtime   - Prescribe rizatriptan  (Maxalt ) with instructions to take one at the first sign of migraine, repeat in two hours if needed, no more than two in a day or four in a week   - consider CGRP in the future - Prescribe methocarbamol  for abortive therapy

## 2024-01-04 NOTE — Progress Notes (Signed)
 Established patient visit   Patient: Rhonda Cole   DOB: 08-30-1996   28 y.o. Female  MRN: [id number] Visit Date: 01/04/2024  Today's healthcare provider: Jon Eva, MD   Chief Complaint  Patient presents with   Headache    Patient has been having headaches for several months.  She was treated 2 months ago with Imitrex  with no success.  She reports that the frequency, intensity and length of migraines has progressively increased.   She states she has been tracking the headaches and reports that over the last 2 months she only had about 5 days where she didn't have any.     Subjective    HPI HPI     Headache    Additional comments: Patient has been having headaches for several months.  She was treated 2 months ago with Imitrex  with no success.  She reports that the frequency, intensity and length of migraines has progressively increased.   She states she has been tracking the headaches and reports that over the last 2 months she only had about 5 days where she didn't have any.        Last edited by Desanto, Elena D, CMA on 01/04/2024  4:02 PM.       Discussed the use of AI scribe software for clinical note transcription with the patient, who gave verbal consent to proceed.  History of Present Illness   The patient, with a history of a benign skull osteoma, presents with persistent migraines, experiencing headaches every day except for about five days a month. The headaches are often accompanied by body aches, constant exhaustion, dizziness, nausea, and occasional vomiting. The patient reports that the migraines are particularly severe around her menstrual period, lasting for several days, and that sumatriptan , a medication she has been taking, has not been effective in managing the pain. The patient also reports a constant dull pain that persists even on days without migraines.  The patient has noticed other symptoms, including body aches, which have been somewhat  alleviated by taking magnesium at night. She reports constant exhaustion, often sleeping for ten to twelve hours and still waking up feeling tired. The patient also reports teeth grinding, often waking up with clenched teeth, and a lot of her pain originates from this area. She has also been experiencing dizziness and nausea more frequently lately, and even vomited once on Christmas Eve. The patient also reports seeing stars when she turns her head in a certain way, such as when shaving her armpit.  The patient has a history of a benign skull osteoma, which has been stable over the years and is not believed to be related to the current symptoms. The patient's mother has a history of persistent headaches, which were managed with nortriptyline .         Medications: Outpatient Medications Prior to Visit  Medication Sig   hydrOXYzine  (ATARAX ) 25 MG tablet take 1-2 tablets by mouth at bedtime if needed for anxiety   norgestimate -ethinyl estradiol  (MILI) 0.25-35 MG-MCG tablet TAKE ONE TABLET BY MOUTH ONE TIME DAILY   ondansetron  (ZOFRAN ) 4 MG tablet Take 1 tablet (4 mg total) by mouth every 8 (eight) hours as needed for nausea or vomiting.   sertraline  (ZOLOFT ) 100 MG tablet Take 2 tablets (200 mg total) by mouth daily.   [DISCONTINUED] SUMAtriptan  (IMITREX ) 100 MG tablet Take 0.5-1 tablets (50-100 mg total) by mouth every 2 (two) hours as needed for migraine. May repeat in 2 hours if headache  persists or recurs.   No facility-administered medications prior to visit.    Review of Systems     Objective    BP 118/78 (BP Location: Left Arm, Patient Position: Sitting, Cuff Size: Normal)   Pulse (!) 55   Temp 99.1 F (37.3 C) (Oral)   Ht 5' 8 (1.727 m)   Wt 144 lb (65.3 kg)   SpO2 99%   BMI 21.90 kg/m    Physical Exam Vitals reviewed.  Constitutional:      General: She is not in acute distress.    Appearance: Normal appearance. She is well-developed. She is not diaphoretic.  HENT:      Head: Normocephalic and atraumatic.     Comments: Small osteoma palpable in R temporal region Eyes:     General: No scleral icterus.    Conjunctiva/sclera: Conjunctivae normal.  Neck:     Thyroid : No thyromegaly.  Cardiovascular:     Rate and Rhythm: Normal rate and regular rhythm.     Pulses: Normal pulses.     Heart sounds: Normal heart sounds. No murmur heard. Pulmonary:     Effort: No respiratory distress.     Breath sounds: Normal breath sounds. No wheezing, rhonchi or rales.  Musculoskeletal:     Cervical back: Neck supple.     Right lower leg: No edema.     Left lower leg: No edema.  Lymphadenopathy:     Cervical: No cervical adenopathy.  Skin:    General: Skin is warm and dry.     Findings: No rash.  Neurological:     Mental Status: She is alert and oriented to person, place, and time. Mental status is at baseline.     Cranial Nerves: No cranial nerve deficit or dysarthria.     Sensory: No sensory deficit.     Motor: No weakness.  Psychiatric:        Mood and Affect: Mood normal.        Behavior: Behavior normal.      No results found for any visits on 01/04/24.  Assessment & Plan     Problem List Items Addressed This Visit       Cardiovascular and Mediastinum   Migraine without aura and without status migrainosus, not intractable - Primary   Chronic migraines and other headaches occurring daily except for five days a month, with increased frequency around menstruation. 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  No repeat imaging needed at this time.  Sumatriptan  is ineffective. Symptoms include dizziness, nausea, and jaw pain. Considering a mixed headache picture with both migraine and tension-type components. Discussed threefold approach: prevention, immediate treatment, and abortive therapy. Nortriptyline  considered due to family history of efficacy. Rizatriptan  suggested as an alternative to sumatriptan .  Methocarbamol  noted effective for severe episodes. Insurance may require trial of two triptans before approving Nurtec. Nortriptyline  benefits include headache prevention and sleep improvement.   - Prescribe nortriptyline  10 mg at bedtime   - Prescribe rizatriptan  (Maxalt ) with instructions to take one at the first sign of migraine, repeat in two hours if needed, no more than two in a day or four in a week   - consider CGRP in the future - Prescribe methocarbamol  for abortive therapy      Relevant Medications   methocarbamol  (ROBAXIN ) 500 MG tablet   rizatriptan  (MAXALT ) 10 MG tablet   nortriptyline  (PAMELOR ) 10 MG capsule   Other Relevant Orders   CBC  with Differential/Platelet   Comprehensive metabolic panel   Vitamin B12   VITAMIN D  25 Hydroxy (Vit-D Deficiency, Fractures)   TSH   Other Visit Diagnoses       Chronic fatigue       Relevant Orders   CBC with Differential/Platelet   Comprehensive metabolic panel   Vitamin B12   VITAMIN D  25 Hydroxy (Vit-D Deficiency, Fractures)   TSH   Ambulatory referral to Sleep Studies     Non-restorative sleep       Relevant Orders   Ambulatory referral to Sleep Studies     Headache syndrome       Relevant Medications   methocarbamol  (ROBAXIN ) 500 MG tablet   rizatriptan  (MAXALT ) 10 MG tablet   nortriptyline  (PAMELOR ) 10 MG capsule   Other Relevant Orders   Ambulatory referral to Sleep Studies          Possible Sleep Apnea   Chronic fatigue despite adequate sleep duration, bruxism, and large tonsils. Considering obstructive sleep apnea. Discussed home sleep study process.   - Order home sleep study    General Health Maintenance   Routine blood work not done in years. Considering underlying metabolic causes for chronic fatigue.   - Order blood tests including kidney and liver function, thyroid  function, complete blood count, vitamin D , and B12 levels          Return in about 4 weeks (around 02/01/2024) for chronic disease f/u.        Jon Eva, MD  [facility] [phone] (phone) [phone] (fax)  [facility]

## 2024-01-05 DIAGNOSIS — F930 Separation anxiety disorder of childhood: Secondary | ICD-10-CM | POA: Diagnosis not present

## 2024-01-05 DIAGNOSIS — G43009 Migraine without aura, not intractable, without status migrainosus: Secondary | ICD-10-CM | POA: Diagnosis not present

## 2024-01-05 DIAGNOSIS — R5382 Chronic fatigue, unspecified: Secondary | ICD-10-CM | POA: Diagnosis not present

## 2024-01-06 LAB — CBC WITH DIFFERENTIAL/PLATELET
Basophils Absolute: 0 10*3/uL (ref 0.0–0.2)
Basos: 0 %
EOS (ABSOLUTE): 0 10*3/uL (ref 0.0–0.4)
Eos: 1 %
Hematocrit: 41.6 % (ref 34.0–46.6)
Hemoglobin: 14.2 g/dL (ref 11.1–15.9)
Immature Grans (Abs): 0 10*3/uL (ref 0.0–0.1)
Immature Granulocytes: 0 %
Lymphocytes Absolute: 1.9 10*3/uL (ref 0.7–3.1)
Lymphs: 28 %
MCH: 30.4 pg (ref 26.6–33.0)
MCHC: 34.1 g/dL (ref 31.5–35.7)
MCV: 89 fL (ref 79–97)
Monocytes Absolute: 0.5 10*3/uL (ref 0.1–0.9)
Monocytes: 7 %
Neutrophils Absolute: 4.4 10*3/uL (ref 1.4–7.0)
Neutrophils: 64 %
Platelets: 222 10*3/uL (ref 150–450)
RBC: 4.67 x10E6/uL (ref 3.77–5.28)
RDW: 12.5 % (ref 11.7–15.4)
WBC: 6.8 10*3/uL (ref 3.4–10.8)

## 2024-01-06 LAB — COMPREHENSIVE METABOLIC PANEL
ALT: 12 [IU]/L (ref 0–32)
AST: 16 [IU]/L (ref 0–40)
Albumin: 4.7 g/dL (ref 4.0–5.0)
Alkaline Phosphatase: 65 [IU]/L (ref 44–121)
BUN/Creatinine Ratio: 15 (ref 9–23)
BUN: 11 mg/dL (ref 6–20)
Bilirubin Total: <0.2 mg/dL (ref 0.0–1.2)
CO2: 22 mmol/L (ref 20–29)
Calcium: 9.7 mg/dL (ref 8.7–10.2)
Chloride: 102 mmol/L (ref 96–106)
Creatinine, Ser: 0.74 mg/dL (ref 0.57–1.00)
Globulin, Total: 2.6 g/dL (ref 1.5–4.5)
Glucose: 105 mg/dL — ABNORMAL HIGH (ref 70–99)
Potassium: 3.8 mmol/L (ref 3.5–5.2)
Sodium: 139 mmol/L (ref 134–144)
Total Protein: 7.3 g/dL (ref 6.0–8.5)
eGFR: 115 mL/min/{1.73_m2} (ref >59)

## 2024-01-06 LAB — VITAMIN B12: Vitamin B-12: 569 pg/mL (ref 232–1245)

## 2024-01-06 LAB — TSH: TSH: 2.45 u[IU]/mL (ref 0.450–4.500)

## 2024-01-06 LAB — VITAMIN D 25 HYDROXY (VIT D DEFICIENCY, FRACTURES): Vit D, 25-Hydroxy: 41.3 ng/mL (ref 30.0–100.0)

## 2024-01-14 DIAGNOSIS — F930 Separation anxiety disorder of childhood: Secondary | ICD-10-CM | POA: Diagnosis not present

## 2024-01-18 ENCOUNTER — Ambulatory Visit (INDEPENDENT_AMBULATORY_CARE_PROVIDER_SITE_OTHER): Payer: Federal, State, Local not specified - PPO | Admitting: Licensed Practical Nurse

## 2024-01-18 ENCOUNTER — Encounter: Payer: Self-pay | Admitting: Licensed Practical Nurse

## 2024-01-18 VITALS — BP 119/78 | HR 90 | Ht 68.0 in | Wt 142.9 lb

## 2024-01-18 DIAGNOSIS — Z01419 Encounter for gynecological examination (general) (routine) without abnormal findings: Secondary | ICD-10-CM

## 2024-01-18 DIAGNOSIS — Z0000 Encounter for general adult medical examination without abnormal findings: Secondary | ICD-10-CM

## 2024-01-18 DIAGNOSIS — F411 Generalized anxiety disorder: Secondary | ICD-10-CM

## 2024-01-18 DIAGNOSIS — G43009 Migraine without aura, not intractable, without status migrainosus: Secondary | ICD-10-CM

## 2024-01-18 MED ORDER — NORGESTIMATE-ETH ESTRADIOL 0.25-35 MG-MCG PO TABS: 1.0000 | ORAL_TABLET | Freq: Every day | ORAL | 12 refills | Status: DC

## 2024-01-18 NOTE — Progress Notes (Signed)
Outpatient Gynecology Note: Annual Visit  Assessment/Plan:    Rhonda Cole is a 28 y.o. female G0P0000 with normal well-woman gynecologic exam. Discussed that her period could have arrived early d/t increased exercise, fatigue. Recommended continuing to track cycles, and she will let us know if changes in cycle occur.    Migraine without aura and without status migrainosus, not intractable Working with her PCP to fine-tune medications.   GAD (generalized anxiety disorder) Continue current medications as prescribed by PCP, as these are working for [employer].     Continue routine health screenings. Discussed next pap to be done in 2027, mammograms begin at age 46, and colonoscopies begin at age 66.    No orders of the defined types were placed in this encounter.  Current Outpatient Medications  Medication Instructions   hydrOXYzine (ATARAX) 25 MG tablet take 1-2 tablets by mouth at bedtime if needed for anxiety   methocarbamol (ROBAXIN) 500 mg, Oral, Every 8 hours PRN   norgestimate-ethinyl estradiol (MILI) 0.25-35 MG-MCG tablet 1 tablet, Oral, Daily   nortriptyline (PAMELOR) 10 mg, Oral, Daily at bedtime   ondansetron (ZOFRAN) 4 mg, Oral, Every 8 hours PRN   rizatriptan (MAXALT) 10 mg, Oral, As needed, May repeat in 2 hours if needed   sertraline (ZOLOFT) 200 mg, Oral, Daily    No follow-ups on file.    Subjective:    Rhonda Cole is a 28 y.o. female G0P0000 who presents for annual wellness visit. She is in good overall health but has been experiencing some generalized fatigue, for which she is getting a workup done with her PCP. She reports she has been exercising more frequently (doing pilates videos) and enjoys this type of exercise. She reports that her periods are more regular with OCPs, although her most recent period arrived on the early side.   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  Feels safe in her home, and  with current partner.   CONCERNS? none  Well Woman Visit:  GYN HISTORY:  Patient's last menstrual period was 01/12/2024 (exact date).     Menstrual History: OB History     Gravida  0   Para  0   Term  0   Preterm  0   AB  0   Living  0      SAB  0   IAB  0   Ectopic  0   Multiple  0   Live Births  0           Menarche age: 61 Patient's last menstrual period was 01/12/2024 (exact date).   Periods are about 28 days apart and last around 5 days.  Dysmenorrhea: some mild cramping Cyclic symptoms include: headache.  Intermenstrual bleeding, spotting, or discharge? none Urinary incontinence? none  Sexually active: yes Number of sexual partners: 1 Gender of sexual Partners: male Dyspareunia? none Last pap: 01/16/2023, NILM  History of abnormal Pap: no Gardasil series:  completed 3 shot series  STI history: none STI/HIV testing or immunizations needed? STI screening offered and was declined Contraceptive methods:  OCPs  (estrogen/progesterone) and condoms  Health Maintenance > Reviewed breast self-awareness > History of abnormal mammogram: n/a > Exercise:  pilates , very active > Body mass index is 21.73 kg/m.  > Recent dental visit Yes.   > Seat Belt Use: Yes.   > Texting and driving? No. > Concern for alcohol abuse? no    Tobacco Use: Low Risk  (  01/04/2024)   Patient History    Smoking Tobacco Use: Never    Smokeless Tobacco Use: Never    Passive Exposure: Not on file     _________________________________________________________  Current Outpatient Medications  Medication Sig Dispense Refill   hydrOXYzine (ATARAX) 25 MG tablet take 1-2 tablets by mouth at bedtime if needed for anxiety 30 tablet 3   methocarbamol (ROBAXIN) 500 MG tablet Take 1 tablet (500 mg total) by mouth every 8 (eight) hours as needed for muscle spasms. 90 tablet 1   norgestimate-ethinyl estradiol (MILI) 0.25-35 MG-MCG tablet Take 1 tablet by mouth daily. 28 tablet 12    nortriptyline (PAMELOR) 10 MG capsule Take 1 capsule (10 mg total) by mouth at bedtime. 30 capsule 1   ondansetron (ZOFRAN) 4 MG tablet Take 1 tablet (4 mg total) by mouth every 8 (eight) hours as needed for nausea or vomiting. 20 tablet 1   rizatriptan (MAXALT) 10 MG tablet Take 1 tablet (10 mg total) by mouth as needed for migraine. May repeat in 2 hours if needed 10 tablet 1   sertraline (ZOLOFT) 100 MG tablet Take 2 tablets (200 mg total) by mouth daily. 180 tablet 3   No current facility-administered medications for this visit.   Allergies  Allergen Reactions   Augmentin [Amoxicillin-Pot Clavulanate] Rash    Past Medical History:  Diagnosis Date   Anxiety    Mild depression    Osteoma of skull    Shingles outbreak 07/2021   nose   Vaccine for human papilloma virus (HPV) types 6, 11, 16, and 18 administered    Past Surgical History:  Procedure Laterality Date   HERNIA REPAIR  1999   double when she was a baby   INGUINAL HERNIA REPAIR Bilateral 2001   TYMPANOSTOMY TUBE PLACEMENT  2000   x2   WISDOM TOOTH EXTRACTION  2018   OB History     Gravida  0   Para  0   Term  0   Preterm  0   AB  0   Living  0      SAB  0   IAB  0   Ectopic  0   Multiple  0   Live Births  0          Social History   Tobacco Use   Smoking status: Never   Smokeless tobacco: Never  Substance Use Topics   Alcohol use: Not Currently   Social History   Substance and Sexual Activity  Sexual Activity Yes   Partners: Male   Birth control/protection: Pill    Immunization History  Administered Date(s) Administered   DTaP 09/11/1998, 11/07/1998, 01/09/1999, 01/10/2000, 08/01/2003   HIB (PRP-OMP) 09/11/1998, 11/07/1998, 01/09/1999, 01/10/2000   HPV 9-valent 10/20/2023, 12/17/2023   Hepatitis A 07/10/2009, 02/06/2011   Hepatitis B 09/11/1998, 11/07/1998, 04/18/1999   IPV 09/11/1998, 11/07/1998, 01/09/1999, 08/01/2003   Influenza-Unspecified 12/01/2017, 10/23/2021,  09/29/2023   MMR 07/09/1999, 08/01/2003   Meningococcal Conjugate 02/06/2011, 06/13/2015   PFIZER(Purple Top)SARS-COV-2 Vaccination 02/09/2020, 03/05/2020   PPD Test 05/30/2022   Pneumococcal-Unspecified 10/16/1999, 01/10/2000   Td 07/10/2009   Tdap 07/10/2009, 12/01/2017   Varicella 07/09/1999, 07/10/2009     Review Of Systems  Constitutional: Denied constitutional symptoms, night sweats, recent illness, fatigue, fever, insomnia and weight loss.  Eyes: Denied eye symptoms, eye pain, photophobia, vision change and visual disturbance.  Ears/Nose/Throat/Neck: Denied ear, nose, throat or neck symptoms, hearing loss, nasal discharge, sinus congestion and sore throat.  Cardiovascular: Denied cardiovascular symptoms,  arrhythmia, chest pain/pressure, edema, exercise intolerance, orthopnea and palpitations.  Respiratory: Denied pulmonary symptoms, asthma, pleuritic pain, productive sputum, cough, dyspnea and wheezing.  Gastrointestinal: Denied, gastro-esophageal reflux, melena, nausea and vomiting.  Genitourinary: Denied genitourinary symptoms including symptomatic vaginal discharge, pelvic relaxation issues, and urinary complaints.  Musculoskeletal: Denied musculoskeletal symptoms, stiffness, swelling, muscle weakness and myalgia.  Dermatologic: Denied dermatology symptoms, rash and scar.  Neurologic: Denied neurology symptoms, dizziness, headache, neck pain and syncope.  Psychiatric: Denied psychiatric symptoms, anxiety and depression.  Endocrine: Denied endocrine symptoms including hot flashes and night sweats.      Objective:    BP 119/78 (BP Location: Left Arm, Cuff Size: Normal)   Pulse 90   Ht 5\' 8"  (1.727 m)   Wt 64.8 kg   LMP 01/12/2024 (Exact Date)   BMI 21.73 kg/m   Constitutional: Well-developed, well-nourished female in no acute distress Neurological: Alert and oriented to person, place, and time Psychiatric: Mood and affect appropriate Skin: No rashes or lesions Neck:  Supple without masses. Trachea is midline.Thyroid is normal size without masses Lymphatics: No cervical, axillary, supraclavicular, or inguinal adenopathy noted Respiratory: Clear to auscultation bilaterally. Good air movement with normal work of breathing. Cardiovascular: Regular rate and rhythm. Extremities grossly normal, nontender with no edema; pulses regular Gastrointestinal: Soft, nontender, nondistended. No masses or hernias appreciated. No hepatosplenomegaly. No fluid wave. No rebound or guarding. Breast Exam: normal appearance, no masses or tenderness Genitourinary: deferred    Adnexae: Non-palpable and non-tender Perineum/Anus: deferred Rectal: deferred   Cindra Eves, [facility] 01/18/2024 12:18PM  Carie Caddy, CNM present for all portions of care.

## 2024-01-18 NOTE — Assessment & Plan Note (Signed)
Working with her PCP to fine-tune medications.

## 2024-01-18 NOTE — Progress Notes (Signed)
Pt seen by SNM R Wolford. This Cnm present for exam. See note Jannifer Hick   [facility]  01/18/24  1:04 PM

## 2024-01-18 NOTE — Assessment & Plan Note (Signed)
Continue current medications as prescribed by PCP, as these are working for Rhonda Cole.

## 2024-01-25 DIAGNOSIS — F930 Separation anxiety disorder of childhood: Secondary | ICD-10-CM | POA: Diagnosis not present

## 2024-01-29 ENCOUNTER — Encounter: Payer: Self-pay | Admitting: Family Medicine

## 2024-02-01 ENCOUNTER — Ambulatory Visit: Payer: Federal, State, Local not specified - PPO | Admitting: Family Medicine

## 2024-02-01 ENCOUNTER — Telehealth: Payer: Self-pay | Admitting: Family Medicine

## 2024-02-01 ENCOUNTER — Encounter: Payer: Self-pay | Admitting: Family Medicine

## 2024-02-01 VITALS — BP 112/81 | HR 98 | Ht 68.0 in | Wt 142.7 lb

## 2024-02-01 DIAGNOSIS — G4489 Other headache syndrome: Secondary | ICD-10-CM

## 2024-02-01 DIAGNOSIS — G43009 Migraine without aura, not intractable, without status migrainosus: Secondary | ICD-10-CM

## 2024-02-01 DIAGNOSIS — G4733 Obstructive sleep apnea (adult) (pediatric): Secondary | ICD-10-CM | POA: Diagnosis not present

## 2024-02-01 DIAGNOSIS — F411 Generalized anxiety disorder: Secondary | ICD-10-CM | POA: Diagnosis not present

## 2024-02-01 MED ORDER — NORTRIPTYLINE HCL 25 MG PO CAPS: 25.0000 mg | ORAL_CAPSULE | Freq: Every day | ORAL | 5 refills | Status: DC

## 2024-02-01 MED ORDER — PROMETHAZINE HCL 25 MG PO TABS: 12.5000 mg | ORAL_TABLET | Freq: Three times a day (TID) | ORAL | 2 refills | Status: DC | PRN

## 2024-02-01 MED ORDER — NURTEC 75 MG PO TBDP: 1.0000 | ORAL_TABLET | ORAL | 5 refills | Status: AC | PRN

## 2024-02-01 NOTE — Telephone Encounter (Signed)
Received a fax from covermymeds for Nurtex 75mg   Key:  [id number]

## 2024-02-01 NOTE — Progress Notes (Signed)
Established patient visit   Patient: Rhonda Cole   DOB: [birth year]   28 y.o. Female  MRN: [id number] Visit Date: 02/01/2024  Today's healthcare provider: Shirlee Latch, MD   Chief Complaint  Patient presents with   Medical Management of Chronic Issues   Migraine    On 12/30 patient reported still having daily persistent headaches and between 3-5 migraines per month with only 4 headache-free days in the past 2 months and that her sumatriptan is also hit or miss on if it helps my migraines. She reports as of today the have gotten a better in some ways. Still having daily headaches but not as noticeable but able to work through them and she still has spells where the rx is not working and will last a week and can only count 2 days since then where she had no headache   Subjective    Migraine    HPI     Migraine    Additional comments: On 12/30 patient reported still having daily persistent headaches and between 3-5 migraines per month with only 4 headache-free days in the past 2 months and that her sumatriptan is also hit or miss on if it helps my migraines. She reports as of today the have gotten a better in some ways. Still having daily headaches but not as noticeable but able to work through them and she still has spells where the rx is not working and will last a week and can only count 2 days since then where she had no headache      Last edited by Acey Lav, CMA on 02/01/2024 10:18 AM.       Discussed the use of AI scribe software for clinical note transcription with the patient, who gave verbal consent to proceed.  History of Present Illness   The patient, with a history of migraines, has been on nortriptyline for about a month. They report that the medication has been somewhat helpful, but the migraines are still present and severe. The patient also experiences daily headaches, which are usually manageable and not as severe as the migraines. The patient  recently had a migraine that lasted about a week, which was not relieved by rizatriptan or muscle relaxers. The patient also reports feeling exhausted, which could be related to their sleep apnea. The patient's sleep study results indicate mild sleep apnea, with an average of nine instances of paused breathing or drops in oxygen level per hour.         Medications: Outpatient Medications Prior to Visit  Medication Sig   hydrOXYzine (ATARAX) 25 MG tablet take 1-2 tablets by mouth at bedtime if needed for anxiety   methocarbamol (ROBAXIN) 500 MG tablet Take 1 tablet (500 mg total) by mouth every 8 (eight) hours as needed for muscle spasms.   norgestimate-ethinyl estradiol (MILI) 0.25-35 MG-MCG tablet Take 1 tablet by mouth daily.   sertraline (ZOLOFT) 100 MG tablet Take 2 tablets (200 mg total) by mouth daily.   [DISCONTINUED] nortriptyline (PAMELOR) 10 MG capsule Take 1 capsule (10 mg total) by mouth at bedtime.   [DISCONTINUED] rizatriptan (MAXALT) 10 MG tablet Take 1 tablet (10 mg total) by mouth as needed for migraine. May repeat in 2 hours if needed   [DISCONTINUED] ondansetron (ZOFRAN) 4 MG tablet Take 1 tablet (4 mg total) by mouth every 8 (eight) hours as needed for nausea or vomiting. (Patient not taking: Reported on 02/01/2024)   No facility-administered medications prior to visit.  Review of Systems     Objective    BP 112/81 (BP Location: Left Arm, Patient Position: Sitting)   Pulse 98   Ht 5\' 8"  (1.727 m)   Wt 142 lb 11.2 oz (64.7 kg)   LMP 01/12/2024 (Exact Date)   SpO2 100%   BMI 21.70 kg/m    Physical Exam Vitals reviewed.  Constitutional:      General: She is not in acute distress.    Appearance: She is well-developed.  HENT:     Head: Normocephalic and atraumatic.  Eyes:     General: No scleral icterus.    Conjunctiva/sclera: Conjunctivae normal.  Cardiovascular:     Rate and Rhythm: Normal rate and regular rhythm.  Pulmonary:     Effort: Pulmonary  effort is normal. No respiratory distress.  Skin:    General: Skin is warm and dry.     Findings: No rash.  Neurological:     Mental Status: She is alert and oriented to person, place, and time.  Psychiatric:        Behavior: Behavior normal.      No results found for any visits on 02/01/24.  Assessment & Plan     Problem List Items Addressed This Visit       Cardiovascular and Mediastinum   Migraine without aura and without status migrainosus, not intractable - Primary   Relevant Medications   Rimegepant Sulfate (NURTEC) 75 MG TBDP   nortriptyline (PAMELOR) 25 MG capsule     Respiratory   OSA (obstructive sleep apnea)     Other   GAD (generalized anxiety disorder)   Relevant Medications   nortriptyline (PAMELOR) 25 MG capsule   Headache syndrome   Relevant Medications   Rimegepant Sulfate (NURTEC) 75 MG TBDP   nortriptyline (PAMELOR) 25 MG capsule        Follow-up - Convert April nurse visit to a full appointment with the doctor - Assess CPAP usage and effectiveness - Evaluate the impact of Nurtec on migraine frequency and severity.       Return in about 2 months (around 03/31/2024) for chronic disease f/u, as scheduled (switch from nurse visit to see me).       Shirlee Latch, MD  [facility] [phone] (phone) [phone] (fax)  [facility]

## 2024-02-01 NOTE — Assessment & Plan Note (Signed)
Sleep study revealed an AHI of 9.1, indicating mild sleep apnea, possibly contributing to daily headaches and exhaustion. Discussed CPAP therapy, which has improved in comfort and noise levels. CPAP machines now use nasal pillows and auto-adjusting pressure. Patient is open to trying CPAP therapy. - Order CPAP machine with auto-adjusting pressure and built-in humidifier - Coordinate with [employer] company for insurance and setup - Schedule follow-up within three months to assess CPAP usage and effectiveness

## 2024-02-01 NOTE — Assessment & Plan Note (Signed)
Nortriptyline at a low dose has provided some relief, but daily headaches and severe migraines persist. Rizatriptan and muscle relaxers are inadequate. Recent migraine episode lasted a week with pain reaching 8/10. Sumatriptan was previously more effective. Discussed CGRP inhibitors Nurtec and Ubrelvy, which may offer better relief with fewer side effects. Nurtec is expected to work for 48 hours with fewer side effects, including less nausea and no rebound headaches. Patient is willing to try these options. - Increase nortriptyline to 25 mg daily - Prescribe Nurtec, 1 tablet every other day as needed - Use a savings card for Nurtec for initial fills - Consider switching to Ubrelvy if Nurtec is not covered by insurance - Prescribe promethazine (Phenergan) 25 mg, take half to one tablet as needed for severe migraines or nausea, especially at bedtime

## 2024-02-10 ENCOUNTER — Other Ambulatory Visit (HOSPITAL_COMMUNITY): Payer: Self-pay

## 2024-02-10 ENCOUNTER — Telehealth: Payer: Self-pay

## 2024-02-10 NOTE — Telephone Encounter (Signed)
Pharmacy Patient Advocate Encounter  Received notification from [facility] that Prior Authorization for Nurtec 75 mg ODT has been APPROVED from 01/11/24 to 08/08/24. Ran test claim, Copay is $0. This test claim was processed through [facility]- copay amounts may vary at other pharmacies due to pharmacy/plan contracts, or as the patient moves through the different stages of their insurance plan.   PA #/Case ID/Reference #:  [id number]  *spoke with [facility] to reprocess

## 2024-02-10 NOTE — Telephone Encounter (Signed)
Pharmacy Patient Advocate Encounter   Received notification from Pt Calls Messages that prior authorization for Nurtec 75MG  dispersible tablets is required/requested.   Insurance verification completed.   The patient is insured through [facility] .   Per test claim: PA required; PA submitted to above mentioned insurance via CoverMyMeds Key/confirmation #/EOC [id number] Status is pending

## 2024-02-16 ENCOUNTER — Other Ambulatory Visit: Payer: Self-pay | Admitting: Licensed Practical Nurse

## 2024-02-16 DIAGNOSIS — Z0000 Encounter for general adult medical examination without abnormal findings: Secondary | ICD-10-CM

## 2024-02-16 DIAGNOSIS — F930 Separation anxiety disorder of childhood: Secondary | ICD-10-CM | POA: Diagnosis not present

## 2024-02-17 DIAGNOSIS — F930 Separation anxiety disorder of childhood: Secondary | ICD-10-CM | POA: Diagnosis not present

## 2024-04-06 ENCOUNTER — Ambulatory Visit: Payer: Self-pay

## 2024-04-06 ENCOUNTER — Emergency Department

## 2024-04-06 ENCOUNTER — Encounter: Payer: Self-pay | Admitting: *Deleted

## 2024-04-06 ENCOUNTER — Emergency Department
Admission: EM | Admit: 2024-04-06 | Discharge: 2024-04-07 | Disposition: A | Discharge status: Home / Self Care | Attending: Emergency Medicine | Admitting: Emergency Medicine

## 2024-04-06 ENCOUNTER — Other Ambulatory Visit: Payer: Self-pay

## 2024-04-06 DIAGNOSIS — R519 Headache, unspecified: Secondary | ICD-10-CM | POA: Insufficient documentation

## 2024-04-06 LAB — BASIC METABOLIC PANEL WITH GFR
Anion gap: 11 (ref 5–15)
BUN: 11 mg/dL (ref 6–20)
CO2: 26 mmol/L (ref 22–32)
Calcium: 9.9 mg/dL (ref 8.9–10.3)
Chloride: 98 mmol/L (ref 98–111)
Creatinine, Ser: 0.78 mg/dL (ref 0.44–1.00)
GFR, Estimated: >60 mL/min (ref >60)
Glucose, Bld: 106 mg/dL — ABNORMAL HIGH (ref 70–99)
Potassium: 3.3 mmol/L — ABNORMAL LOW (ref 3.5–5.1)
Sodium: 135 mmol/L (ref 135–145)

## 2024-04-06 LAB — CBC
HCT: 44 % (ref 36.0–46.0)
Hemoglobin: 15.2 g/dL — ABNORMAL HIGH (ref 12.0–15.0)
MCH: 30 pg (ref 26.0–34.0)
MCHC: 34.5 g/dL (ref 30.0–36.0)
MCV: 87 fL (ref 80.0–100.0)
Platelets: 222 10*3/uL (ref 150–400)
RBC: 5.06 MIL/uL (ref 3.87–5.11)
RDW: 12.3 % (ref 11.5–15.5)
WBC: 6.7 10*3/uL (ref 4.0–10.5)
nRBC: 0 % (ref 0.0–0.2)

## 2024-04-06 LAB — POC URINE PREG, ED: Preg Test, Ur: NEGATIVE

## 2024-04-06 LAB — TSH: TSH: 2.504 u[IU]/mL (ref 0.350–4.500)

## 2024-04-06 MED ORDER — PROCHLORPERAZINE EDISYLATE 10 MG/2ML IJ SOLN
10.0000 mg | Freq: Once | INTRAMUSCULAR | Status: DC
  Filled 2024-04-06: qty 2

## 2024-04-06 MED ORDER — DIPHENHYDRAMINE HCL 50 MG/ML IJ SOLN
25.0000 mg | Freq: Once | INTRAMUSCULAR | Status: DC
  Filled 2024-04-06: qty 1

## 2024-04-06 MED ORDER — LACTATED RINGERS IV BOLUS: 1000.0000 mL | Freq: Once | INTRAVENOUS | Status: DC

## 2024-04-06 MED ORDER — ACETAMINOPHEN 500 MG PO TABS
1000.0000 mg | ORAL_TABLET | Freq: Once | ORAL | Status: DC
  Filled 2024-04-06: qty 2

## 2024-04-06 NOTE — Discharge Instructions (Addendum)
 Both Dr. Malvin Johns and Dr. Sherryll Burger are excellent neurologist who you can call for a follow-up appointment for your headaches.  Thank you for choosing [place] for your health care today!  Please see your primary doctor this week for a follow up appointment.   If you have any new, worsening, or unexpected symptoms call your doctor right away or come back to the emergency department for reevaluation.  It was my pleasure to care for you today.   Daneil Dan Modesto Charon, MD

## 2024-04-06 NOTE — ED Notes (Signed)
 No ct scan at this time per dr Larinda Buttery  labs only

## 2024-04-06 NOTE — ED Provider Triage Note (Signed)
 Emergency Medicine Provider Triage Evaluation Note  Emanuela Runnion , a 28 y.o. female  was evaluated in triage.  Pt complains of numbness and tingling in fingers.  Patient has history of migraine, patient is taking migraine treatment.  Consulted through her PCP who referred her to the ED.  Patient has history of osteoma  Review of Systems  Positive: Negative:   Physical Exam  Ht 5\' 8"  (1.727 m)   Wt 61.2 kg   LMP 04/06/2024 (Exact Date)   BMI 20.53 kg/m  Gen:   Awake, no distress   Resp:  Normal effort  MSK:   Moves extremities without difficulty  Other:    Medical Decision Making  Medically screening exam initiated at 7:12 PM.  Appropriate orders placed.  Nadeen Roel was informed that the remainder of the evaluation will be completed by another provider, this initial triage assessment does not replace that evaluation, and the importance of remaining in the ED until their evaluation is complete.  Patient with migraine numbness and tingling send upper extremities   Gladys Damme, PA-C 04/06/24 1913

## 2024-04-06 NOTE — ED Provider Notes (Signed)
 [facility] Provider Note    Event Date/Time   First MD Initiated Contact with Patient 04/06/24 2127     (approximate)   History   Headache   HPI  Rhonda Cole is a 28 y.o. female with history of migraines, anxiety, osteoma of the skull, presenting with headache.  States been ongoing for the last 5 days, also with dissociated feeling and sensory changes to her fingertips that she states is not numbness tingling.  No recent trauma or falls, no fevers, no recent infections.  No new medications.  No weakness or numbness or vision changes.  States headache is bitemporal and bifrontal.  Not worst headache of her life, not sudden on the side thunderclap.  On independent review, she was seen by her primary care doctor in February for migraines without aura, reported having daily persistent headaches about 3 to 5/month, she is on Nurtec for the headaches.  Patient states that she has not followed up with neurology yet for her migraines.     Physical Exam   Triage Vital Signs: ED Triage Vitals  Encounter Vitals Group     BP 04/06/24 1912 (!) 133/94     Systolic BP Percentile --      Diastolic BP Percentile --      Pulse Rate 04/06/24 1912 (!) 111     Resp 04/06/24 1912 18     Temp 04/06/24 1912 98.7 F (37.1 C)     Temp Source 04/06/24 1912 Oral     SpO2 04/06/24 1912 99 %     Weight 04/06/24 1909 135 lb (61.2 kg)     Height 04/06/24 1909 5\' 8"  (1.727 m)     Head Circumference --      Peak Flow --      Pain Score 04/06/24 1909 6     Pain Loc --      Pain Education --      Exclude from Growth Chart --     Most recent vital signs: Vitals:   04/06/24 1912  BP: (!) 133/94  Pulse: (!) 111  Resp: 18  Temp: 98.7 F (37.1 C)  SpO2: 99%     General: Awake, no distress.  CV:  Good peripheral perfusion.  Resp:  Normal effort.  Abd:  No distention.  Soft nontender Other:  Pupils are equal reactive, extraocular movements are intact, no nuchal  rigidity, no cranial nerve deficits, no focal weakness or numbness, she is steady gait.   ED Results / Procedures / Treatments   Labs (all labs ordered are listed, but only abnormal results are displayed) Labs Reviewed  BASIC METABOLIC PANEL WITH GFR - Abnormal; Notable for the following components:      Result Value   Potassium 3.3 (*)    Glucose, Bld 106 (*)    All other components within normal limits  CBC - Abnormal; Notable for the following components:   Hemoglobin 15.2 (*)    All other components within normal limits  TSH  POC URINE PREG, ED     PROCEDURES:  Critical Care performed: No  Procedures   MEDICATIONS ORDERED IN ED: Medications  lactated ringers bolus 1,000 mL (1,000 mLs Intravenous Patient Refused/Not Given 04/06/24 2243)  prochlorperazine (COMPAZINE) injection 10 mg (10 mg Intravenous Patient Refused/Not Given 04/06/24 2243)  diphenhydrAMINE (BENADRYL) injection 25 mg (25 mg Intravenous Patient Refused/Not Given 04/06/24 2243)  acetaminophen (TYLENOL) tablet 1,000 mg (1,000 mg Oral Patient Refused/Not Given 04/06/24 2243)  IMPRESSION / MDM / ASSESSMENT AND PLAN / ED COURSE  I reviewed the triage vital signs and the nursing notes.                              Differential diagnosis includes, but is not limited to, migraines, tension headache, no recent trauma or falls to suggest traumatic intracranial injury, not sudden onset, not worst of her life or thunderclap headache, no focal deficits to suggest [facility] or ICH or CVA.  Given her history of osteoma, discussed about getting a CT head.  Also discussed labs as well as migraine cocktail.  Patient is agreeable with the plan.  Patient's presentation is most consistent with acute presentation with potential threat to life or bodily function.  Independent review of labs imaging are below.  Patient signed out pending CT imaging results.  Likely able to be discharge with neurology outpatient follow-up.  Clinical  Course as of 04/06/24 2359  Wed Apr 06, 2024  2246 Patient now asking for just neurology phone number to follow-up, is declining migraine cocktail. [TT]  2255 Independent review of labs, no leukocytosis, pregnancy is negative, electrolytes not severely deranged, creatinine is normal. [TT]    Clinical Course User Index [TT] Jodie Echevaria, Franchot Erichsen, MD     FINAL CLINICAL IMPRESSION(S) / ED DIAGNOSES   Final diagnoses:  Acute nonintractable headache, unspecified headache type     Rx / DC Orders   ED Discharge Orders     None        Note:  This document was prepared using Dragon voice recognition software and may include unintentional dictation errors.    Claybon Jabs, MD 04/06/24 [phone]

## 2024-04-06 NOTE — ED Triage Notes (Signed)
 Pt ambulatory to triage.  Pt reports a headache for 5-6 days.  Pt also reports tingling in fingers/hands.  Pt has nausea.  Pt taking rx meds for migraines.  Pt alert  speech clear.

## 2024-04-06 NOTE — Telephone Encounter (Signed)
 Chief Complaint: Feeling off Symptoms: see notes Frequency: 5 days Pertinent Negatives: Patient denies chest pain, difficulty breathing Disposition: [x] ED /[] Urgent Care (no appt availability in office) / [] Appointment(In office/virtual)/ []  [facility]/ [] Home Care/ [] Refused Recommended Disposition /[] [facility]/ []  Follow-up with PCP Additional Notes: Patient called in initially looking to schedule an appt with PCP. Patient states she had a bad migraine 5 days ago and has had constant symptoms of disassociation, losing balance while walking intermittently, dizziness, decreased sensation in face and hands. 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 This RN referred patient to ED for evaluation and told patient to follow up with PCP after. Patient verbalized understanding.    Copied from CRM [id number]. Topic: Clinical - Red Word Triage >> Apr 06, 2024  4:01 PM Yolanda T wrote: Red Word that prompted transfer to Nurse Triage: migraine 5 days ago that triggered numbness in face, hands/arms and feeling that her senses are off. Dizzy and disassociated from her body and mind. Answer Assessment - Initial Assessment Questions 1. SYMPTOM: "What is the main symptom you are concerned about?" (e.g., weakness, numbness)     Decreased sensitivity of face and hands, body and mind disconnected 2. ONSET: "When did this start?" (minutes, hours, days; while sleeping)     5 days ago with migraine 3. LAST NORMAL: "When was the last time you (the patient) were normal (no symptoms)?"     5 days ago 4. PATTERN "Does this come and go, or has it been constant since it started?"  "Is it present now?"     Been constant  5. CARDIAC SYMPTOMS: "Have you had any of the following symptoms: chest pain, difficulty breathing, palpitations?"     No 6. NEUROLOGIC  SYMPTOMS: "Have you had any of the following symptoms: headache, dizziness, vision loss, double vision, changes in speech, unsteady on your feet?"     Migraine, hard time piecing together thoughts, dizziness when standing and sitting, occasional unsteadiness when walking 7. OTHER SYMPTOMS: "Do you have any other symptoms?"     All reported above 8. PREGNANCY: "Is there any chance you are pregnant?" "When was your last menstrual period?"     No - on cycle right now  Protocols used: Neurologic Deficit-A-AH  Reason for Disposition  [1] Numbness (i.e., loss of sensation) of the face, arm / hand, or leg / foot on one side of the body AND [2] sudden onset AND [3] brief (now gone)  Answer Assessment - Initial Assessment Questions 1. SYMPTOM: "What is the main symptom you are concerned about?" (e.g., weakness, numbness)     Decreased sensitivity of face and hands, body and mind disconnected 2. ONSET: "When did this start?" (minutes, hours, days; while sleeping)     5 days ago with migraine 3. LAST NORMAL: "When was the last time you (the patient) were normal (no symptoms)?"     5 days ago 4. PATTERN "Does this come and go, or has it been constant since it started?"  "Is it present now?"     Been constant  5. CARDIAC SYMPTOMS: "Have you had any of the following symptoms: chest pain, difficulty breathing, palpitations?"     No 6. NEUROLOGIC SYMPTOMS: "Have you had any of the following symptoms: headache, dizziness, vision loss, double vision, changes in speech, unsteady on your feet?"  Migraine, hard time piecing together thoughts, dizziness when standing and sitting, occasional unsteadiness when walking 7. OTHER SYMPTOMS: "Do you have any other symptoms?"     All reported above 8. PREGNANCY: "Is there any chance you are pregnant?" "When was your last menstrual period?"     No - on cycle right now  Protocols used: Neurologic Deficit-A-AH

## 2024-04-07 NOTE — ED Provider Notes (Signed)
 CT head negative.  Patient stable ready and eager for discharge.  Referral to neurology contact information given to patient and her mother.  Discharge.   Pilar Jarvis, MD 04/07/24 [phone]

## 2024-04-07 NOTE — Telephone Encounter (Signed)
 Noted.

## 2024-04-11 ENCOUNTER — Ambulatory Visit (INDEPENDENT_AMBULATORY_CARE_PROVIDER_SITE_OTHER): Admitting: Family Medicine

## 2024-04-11 ENCOUNTER — Encounter: Payer: Self-pay | Admitting: Family Medicine

## 2024-04-11 VITALS — BP 128/88 | HR 116 | Ht 68.0 in | Wt 144.5 lb

## 2024-04-11 DIAGNOSIS — G43009 Migraine without aura, not intractable, without status migrainosus: Secondary | ICD-10-CM

## 2024-04-11 DIAGNOSIS — F411 Generalized anxiety disorder: Secondary | ICD-10-CM

## 2024-04-11 DIAGNOSIS — G4733 Obstructive sleep apnea (adult) (pediatric): Secondary | ICD-10-CM | POA: Diagnosis not present

## 2024-04-11 DIAGNOSIS — R739 Hyperglycemia, unspecified: Secondary | ICD-10-CM | POA: Diagnosis not present

## 2024-04-11 LAB — POCT GLYCOSYLATED HEMOGLOBIN (HGB A1C): Hemoglobin A1C: 5.1 % (ref 4.0–5.6)

## 2024-04-11 NOTE — Progress Notes (Signed)
 Established patient visit   Patient: Rhonda Cole   DOB: 07-05-1996   27 y.o. Female  MRN: [id number] Visit Date: 04/11/2024  Today's healthcare provider: Shirlee Latch, MD   Chief Complaint  Patient presents with   Follow-up    Patient was seen at [facility] due to neurp symptoms for 5 bays that lead to dizziness, loss of balance and numbness. Patient reports she has been feeling slightly better with a little bit of dizziness and feeling disconnected but not as severe. No appointment with neuro does not believe referral was placed   Subjective    HPI HPI     Follow-up    Additional comments: Patient was seen at [facility] due to neurp symptoms for 5 bays that lead to dizziness, loss of balance and numbness. Patient reports she has been feeling slightly better with a little bit of dizziness and feeling disconnected but not as severe. No appointment with neuro does not believe referral was placed      Last edited by Acey Lav, CMA on 04/11/2024  2:11 PM.       Discussed the use of AI scribe software for clinical note transcription with the patient, who gave verbal consent to proceed.  History of Present Illness   The patient, with a history of migraines and anxiety, presents with symptoms she believes are related to mental health issues. She describes a feeling of disconnection, dizziness, and balance problems that started with a migraine but persisted even after the migraine resolved. The patient reports that these symptoms have been present for about five days and have improved slightly with increased social interaction.  In addition to these symptoms, the patient has noticed an increase in tremors and muscle spasms. She reports that these symptoms have worsened, with noticeable leg muscle spasms during activities such as getting her nails done.  The patient has recently started trauma therapy and believes that her brain may be dissociating in response to stress and anxiety.  She is considering a change in her anxiety medication, which she has been on for about five years, as she feels it may no longer be effective.  The patient also has a history of sleep apnea. She has been unable to afford a CPAP machine and is currently managing the condition by sleeping in a bed with adjustable head and leg positions. She reports that this has helped with her breathing and snoring.           04/11/2024    2:15 PM 02/01/2024   10:16 AM 01/04/2024    4:03 PM 10/20/2023   10:52 AM 09/03/2023    8:07 AM  Depression screen PHQ 2/9  Decreased Interest 2 1 0 0 0  Down, Depressed, Hopeless 2 1 0 0 0  PHQ - 2 Score 4 2 0 0 0  Altered sleeping 1 3 2  0 2  Tired, decreased energy 3 3 3 1 1   Change in appetite 1 1 1  0 0  Feeling bad or failure about yourself  1 0 0 0 0  Trouble concentrating 1 1 1 1 1   Moving slowly or fidgety/restless 2 0 0 0 0  Suicidal thoughts 0 0 0 0 0  PHQ-9 Score 13 10 7 2 4   Difficult doing work/chores Very difficult Somewhat difficult Somewhat difficult Somewhat difficult Somewhat difficult       04/11/2024    2:15 PM 02/01/2024   10:17 AM 01/04/2024    4:04 PM 10/20/2023   10:52  AM  GAD 7 : Generalized Anxiety Score  Nervous, Anxious, on Edge 2 3 3 2   Control/stop worrying 2 2 3 1   Worry too much - different things 2 2 3 1   Trouble relaxing 1 1 2 1   Restless 1 0 1 0  Easily annoyed or irritable 2 1 2 1   Afraid - awful might happen 0 1 3 0  Total GAD 7 Score 10 10 17 6   Anxiety Difficulty Very difficult Very difficult Very difficult Somewhat difficult     Medications: Outpatient Medications Prior to Visit  Medication Sig   hydrOXYzine (ATARAX) 25 MG tablet take 1-2 tablets by mouth at bedtime if needed for anxiety   methocarbamol (ROBAXIN) 500 MG tablet Take 1 tablet (500 mg total) by mouth every 8 (eight) hours as needed for muscle spasms.   norgestimate-ethinyl estradiol (MILI) 0.25-35 MG-MCG tablet Take 1 tablet by mouth daily.    nortriptyline (PAMELOR) 25 MG capsule Take 1 capsule (25 mg total) by mouth at bedtime.   promethazine (PHENERGAN) 25 MG tablet Take 0.5-1 tablets (12.5-25 mg total) by mouth every 8 (eight) hours as needed for nausea (migraine).   Rimegepant Sulfate (NURTEC) 75 MG TBDP Take 1 tablet (75 mg total) by mouth every other day as needed.   sertraline (ZOLOFT) 100 MG tablet Take 2 tablets (200 mg total) by mouth daily.   No facility-administered medications prior to visit.    Review of Systems     Objective    BP 128/88 (BP Location: Left Arm, Patient Position: Sitting, Cuff Size: Normal)   Pulse (!) 116   Ht 5\' 8"  (1.727 m)   Wt 144 lb 8 oz (65.5 kg)   LMP 04/06/2024 (Exact Date)   SpO2 98%   BMI 21.97 kg/m    Physical Exam Vitals reviewed.  Constitutional:      General: She is not in acute distress.    Appearance: Normal appearance. She is well-developed. She is not diaphoretic.  HENT:     Head: Normocephalic and atraumatic.  Eyes:     General: No scleral icterus.    Conjunctiva/sclera: Conjunctivae normal.  Neck:     Thyroid: No thyromegaly.  Cardiovascular:     Rate and Rhythm: Normal rate and regular rhythm.     Heart sounds: Normal heart sounds. No murmur heard. Pulmonary:     Effort: Pulmonary effort is normal. No respiratory distress.     Breath sounds: Normal breath sounds. No wheezing, rhonchi or rales.  Musculoskeletal:     Cervical back: Neck supple.     Right lower leg: No edema.     Left lower leg: No edema.  Lymphadenopathy:     Cervical: No cervical adenopathy.  Skin:    General: Skin is warm and dry.     Findings: No rash.  Neurological:     Mental Status: She is alert. Mental status is at baseline.     Cranial Nerves: No cranial nerve deficit.     Sensory: No sensory deficit.     Motor: No weakness.     Coordination: Coordination normal.     Gait: Gait normal.     Deep Tendon Reflexes: Reflexes normal.  Psychiatric:        Mood and Affect: Mood  normal.        Behavior: Behavior normal.      No results found for any visits on 04/11/24.  Assessment & Plan     Problem List Items Addressed This Visit  Cardiovascular and Mediastinum   Migraine without aura and without status migrainosus, not intractable     Respiratory   OSA (obstructive sleep apnea)     Other   GAD (generalized anxiety disorder) - Primary   Other Visit Diagnoses       Hyperglycemia       Relevant Orders   POCT HgB A1C           Migraine She experienced a migraine that resolved, but continued to have symptoms such as dizziness, balance issues, and a faint feeling for five days. The ER visit did not reveal any acute issues, and a CT scan was normal, ruling out structural causes. Nurtec is effective for her migraines, with occasional use of a muscle relaxer when needed. The CT scan results are reassuring. - Continue Nurtec for migraine management prn - continue nortriptyline for migraine prevention - does not need Neuro referral currently, but will let us  know if she changes her mind in the future  Anxiety and Stress She has been experiencing increased anxiety and stress, potentially related to recent trauma and life stressors. Her therapist suggested that her brain might be dissociating due to stress. She is currently on sertraline for anxiety, which she has been taking for five years. Discussed the possibility of switching medications or seeing a psychiatrist, but she prefers to wait due to current stressors. The option of a genetic swab (Genesight) to determine the best medication for her was also discussed. The genetic swab can help identify how she metabolizes medications, potentially guiding future treatment decisions. - Consider genetic swab (Genesight) for medication metabolism - Discussed potential referral to psychiatry, but she prefers to wait  Tremors She reports worsening tremors, which she has experienced in the past. Anxiety and  stress can exacerbate tremors. The tremors are not believed to be related to her current medications.  Sleep Apnea She attempted to obtain a CPAP machine but found it cost-prohibitive. She has been using an adjustable bed to help with breathing and snoring, which has been effective. This is a reasonable approach given the circumstances. - Continue using adjustable bed for sleep apnea management  Elevated Blood Sugar She had a slightly elevated blood sugar level, but it was not fasting. A fasting blood sugar of 105 mg/dL is slightly elevated, but not concerning. An A1c test was suggested to get a three-month average of blood sugar levels, especially since diabetes runs in her family. - Perform A1c test to assess average blood sugar levels        Return in about 6 months (around 10/11/2024) for CPE.       Aden Agreste, MD  [facility] [phone] (phone) [phone] (fax)  [facility]

## 2024-04-19 ENCOUNTER — Ambulatory Visit: Payer: Federal, State, Local not specified - PPO | Admitting: Family Medicine

## 2024-04-19 ENCOUNTER — Ambulatory Visit: Admitting: Family Medicine

## 2024-04-19 ENCOUNTER — Encounter: Payer: Self-pay | Admitting: Family Medicine

## 2024-04-19 DIAGNOSIS — Z23 Encounter for immunization: Secondary | ICD-10-CM

## 2024-04-19 NOTE — Progress Notes (Signed)
 Patient here for HPV vaccination only.  I did not examine the patient.  I did review her medical history, medications, and allergies and vaccine consent form.  CMA gave vaccination. Patient tolerated well.  Mazie Speed, MD, MPH [facility] 04/19/2024 4:47 PM

## 2024-04-19 NOTE — Addendum Note (Signed)
 Addended by: [place] on: 04/19/2024 04:50 PM   Modules accepted: Orders

## 2024-05-20 ENCOUNTER — Telehealth: Admitting: Emergency Medicine

## 2024-05-20 DIAGNOSIS — L03311 Cellulitis of abdominal wall: Secondary | ICD-10-CM | POA: Diagnosis not present

## 2024-05-20 MED ORDER — DOXYCYCLINE HYCLATE 100 MG PO CAPS: 100.0000 mg | ORAL_CAPSULE | Freq: Two times a day (BID) | ORAL | 0 refills | Status: DC

## 2024-05-20 NOTE — Patient Instructions (Signed)
  Rhonda Cole, thank you for joining Sherel Dikes, PA-C for today's virtual visit.  While this provider is not your primary care provider (PCP), if your PCP is located in our provider database this encounter information will be shared with them immediately following your visit.   A [facility] MyChart account gives you access to today's visit and all your visits, tests, and labs performed at [facility] " click here if you don't have a [place] [facility] MyChart account or go to [email or web address]  Consent: (Patient) Rhonda Cole provided verbal consent for this virtual visit at the beginning of the encounter.  Current Medications:  Current Outpatient Medications:    doxycycline  (VIBRAMYCIN ) 100 MG capsule, Take 1 capsule (100 mg total) by mouth 2 (two) times daily., Disp: 20 capsule, Rfl: 0   hydrOXYzine  (ATARAX ) 25 MG tablet, take 1-2 tablets by mouth at bedtime if needed for anxiety, Disp: 30 tablet, Rfl: 3   methocarbamol  (ROBAXIN ) 500 MG tablet, Take 1 tablet (500 mg total) by mouth every 8 (eight) hours as needed for muscle spasms., Disp: 90 tablet, Rfl: 1   norgestimate -ethinyl estradiol  (MILI) 0.25-35 MG-MCG tablet, Take 1 tablet by mouth daily., Disp: 28 tablet, Rfl: 12   nortriptyline  (PAMELOR ) 25 MG capsule, Take 1 capsule (25 mg total) by mouth at bedtime., Disp: 30 capsule, Rfl: 5   promethazine  (PHENERGAN ) 25 MG tablet, Take 0.5-1 tablets (12.5-25 mg total) by mouth every 8 (eight) hours as needed for nausea (migraine)., Disp: 20 tablet, Rfl: 2   Rimegepant Sulfate (NURTEC) 75 MG TBDP, Take 1 tablet (75 mg total) by mouth every other day as needed., Disp: 15 tablet, Rfl: 5   sertraline  (ZOLOFT ) 100 MG tablet, Take 2 tablets (200 mg total) by mouth daily., Disp: 180 tablet, Rfl: 3   Medications ordered in this encounter:  Meds ordered this encounter  Medications   doxycycline  (VIBRAMYCIN ) 100 MG capsule    Sig: Take 1 capsule (100 mg total) by mouth 2 (two) times  daily.    Dispense:  20 capsule    Refill:  0    Supervising Provider:   Corine Dice [[id number]]     *If you need refills on other medications prior to your next appointment, please contact your pharmacy*  Follow-Up: Call back or seek an in-person evaluation if the symptoms worsen or if the condition fails to improve as anticipated.  [facility] [phone]  Other Instructions    If you have been instructed to have an in-person evaluation today at a local Urgent Care facility, please use the link below. It will take you to a list of all of our available [facility], including address, phone number and hours of operation. Please do not delay care.  [facility]  If you or a family member do not have a primary care provider, use the link below to schedule a visit and establish care. When you choose a [facility] primary care physician or advanced practice provider, you gain a long-term partner in health. Find a Primary Care Provider  Learn more about [facility]'s in-office and virtual care options: [facility] - Get Care Now

## 2024-05-20 NOTE — Progress Notes (Signed)
 Virtual Visit Consent   Rhonda Cole, you are scheduled for a virtual visit with a [facility] provider today. Just as with appointments in the office, your consent must be obtained to participate. Your consent will be active for this visit and any virtual visit you may have with one of our providers in the next 365 days. If you have a MyChart account, a copy of this consent can be sent to you electronically.  As this is a virtual visit, video technology does not allow for your provider to perform a traditional examination. This may limit your provider's ability to fully assess your condition. If your provider identifies any concerns that need to be evaluated in person or the need to arrange testing (such as labs, EKG, etc.), we will make arrangements to do so. Although advances in technology are sophisticated, we cannot ensure that it will always work on either your end or our end. If the connection with a video visit is poor, the visit may have to be switched to a telephone visit. With either a video or telephone visit, we are not always able to ensure that we have a secure connection.  By engaging in this virtual visit, you consent to the provision of healthcare and authorize for your insurance to be billed (if applicable) for the services provided during this visit. Depending on your insurance coverage, you may receive a charge related to this service.  I need to obtain your verbal consent now. Are you willing to proceed with your visit today? Rhonda Cole has provided verbal consent on 05/20/2024 for a virtual visit (video or telephone). Rhonda Dikes, PA-C  Date: 05/20/2024 11:52 AM   Virtual Visit via Video Note   I, Rhonda Cole, connected with  Rhonda Cole  ([id number], 09-29-1996) on 05/20/24 at 11:45 AM EDT by a video-enabled telemedicine application and verified that I am speaking with the correct person using two identifiers.  Location: Patient: Virtual Visit Location Patient:  Home Provider: Virtual Visit Location Provider: Home Office   I discussed the limitations of evaluation and management by telemedicine and the availability of in person appointments. The patient expressed understanding and agreed to proceed.    History of Present Illness: Rhonda Cole is a 28 y.o. who identifies as a female who was assigned female at birth, and is being seen today for insect bite.  She states that she noticed about 5 days ago. Denies any changes, not worsening or improving.  States that she has felt a bit "off."  She denies fever or chills.  She denies pregnancy or breastfeeding.  She states that she did get scratched by a cat, but the scratch occurred after the redness had appeared.  She denies known tick bites. She didn't see anything bite her.  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25-35 MG-MCG tablet, Take 1 tablet by mouth daily., Disp: 28 tablet, Rfl: 12   nortriptyline  (PAMELOR ) 25 MG capsule, Take 1 capsule (25  mg total) by mouth at bedtime., Disp: 30 capsule, Rfl: 5   promethazine  (PHENERGAN ) 25 MG tablet, Take 0.5-1 tablets (12.5-25 mg total) by mouth every 8 (eight) hours as needed for nausea (migraine)., Disp: 20 tablet, Rfl: 2   Rimegepant Sulfate (NURTEC) 75 MG TBDP, Take 1 tablet (75 mg total) by mouth every other day as needed., Disp: 15 tablet, Rfl: 5   sertraline  (ZOLOFT ) 100 MG tablet, Take 2 tablets (200 mg total) by mouth daily., Disp: 180 tablet, Rfl: 3  Observations/Objective: Patient is well-developed, well-nourished in no acute distress.  Resting comfortably at home.  Head is normocephalic, atraumatic.  No labored breathing.  Speech is clear and coherent with logical content.  Patient is alert and oriented at baseline.  Small erythematous region on anterior abdomen, estimated to be about 3x3cm, no streaking or apparent signs of abscess  Assessment and Plan: 1. Cellulitis of abdominal wall (Primary)  Patient with small erythematous region on abdomen.  No evidence of abscess.  Patient looks well. Will trial treatment for cellulitis with doxy. Meds ordered this encounter  Medications   doxycycline  (VIBRAMYCIN ) 100 MG capsule    Sig: Take 1 capsule (100 mg total) by mouth 2 (two) times daily.    Dispense:  20 capsule    Refill:  0    Supervising Provider:   Corine Dice [[id number]]     Follow Up Instructions: I discussed the assessment and treatment plan with the patient. The patient was provided an opportunity to ask questions and all were answered. The patient agreed with the plan and demonstrated an understanding of the instructions.  A copy of instructions were sent to the patient via MyChart unless otherwise noted below.     The patient was advised to call back or seek an in-person evaluation if the symptoms worsen or if the condition fails to improve as anticipated.    Rhonda Dikes, PA-C

## 2024-05-30 ENCOUNTER — Encounter: Payer: Self-pay | Admitting: Family Medicine

## 2024-06-03 NOTE — Telephone Encounter (Signed)
 FYI

## 2024-07-30 ENCOUNTER — Other Ambulatory Visit: Payer: Self-pay | Admitting: Family Medicine

## 2024-08-12 ENCOUNTER — Other Ambulatory Visit (HOSPITAL_COMMUNITY): Payer: Self-pay

## 2024-08-12 ENCOUNTER — Telehealth: Payer: Self-pay

## 2024-08-12 NOTE — Telephone Encounter (Signed)
 Pharmacy Patient Advocate Encounter   Received notification from Onbase that prior authorization for Nurtec 75MG  dispersible tablets is required/requested.   Insurance verification completed.   The patient is insured through [facility] .   Per test claim: PA required; PA submitted to above mentioned insurance via Latent Key/confirmation #/EOC [id number] Status is pending

## 2024-08-12 NOTE — Telephone Encounter (Signed)
 Pharmacy Patient Advocate Encounter  Received notification from [facility] that Prior Authorization for Nurtec 75MG  dispersible tablets has been APPROVED from 08/12/2024 to 11/12/2024   PA #/Case ID/Reference #: [id number]

## 2024-10-21 ENCOUNTER — Telehealth: Payer: Self-pay

## 2024-10-21 NOTE — Telephone Encounter (Signed)
 Pharmacy Patient Advocate Encounter   Received notification from CoverMyMeds that prior authorization for Nurtec 75mg  tabs is due for renewal.   Insurance verification completed.   The patient is insured through [facility].  Action: PA required; PA submitted to above mentioned insurance via Latent Key/confirmation #/EOC [id number] Status is pending

## 2024-10-21 NOTE — Telephone Encounter (Signed)
 Pharmacy Patient Advocate Encounter  Received notification from [facility] that Prior Authorization renewal for Nurtec 75mg  tabs has been APPROVED from 10/21/24 to 10/21/26   PA #/Case ID/Reference #: [id number]

## 2024-10-24 ENCOUNTER — Encounter: Admitting: Family Medicine

## 2024-11-10 ENCOUNTER — Encounter: Payer: Self-pay | Admitting: Family Medicine

## 2024-11-10 ENCOUNTER — Ambulatory Visit: Admitting: Family Medicine

## 2024-11-10 VITALS — BP 122/70 | HR 118 | Ht 68.0 in | Wt 157.6 lb

## 2024-11-10 DIAGNOSIS — F429 Obsessive-compulsive disorder, unspecified: Secondary | ICD-10-CM | POA: Insufficient documentation

## 2024-11-10 DIAGNOSIS — Z23 Encounter for immunization: Secondary | ICD-10-CM | POA: Diagnosis not present

## 2024-11-10 DIAGNOSIS — F422 Mixed obsessional thoughts and acts: Secondary | ICD-10-CM | POA: Diagnosis not present

## 2024-11-10 DIAGNOSIS — Z0001 Encounter for general adult medical examination with abnormal findings: Secondary | ICD-10-CM

## 2024-11-10 DIAGNOSIS — G43009 Migraine without aura, not intractable, without status migrainosus: Secondary | ICD-10-CM

## 2024-11-10 DIAGNOSIS — F411 Generalized anxiety disorder: Secondary | ICD-10-CM | POA: Diagnosis not present

## 2024-11-10 DIAGNOSIS — Z0000 Encounter for general adult medical examination without abnormal findings: Secondary | ICD-10-CM

## 2024-11-10 NOTE — Progress Notes (Signed)
 Complete physical exam   Patient: Rhonda Cole   DOB: 1996-06-24   28 y.o. Female  MRN: [id number] Visit Date: 11/10/2024  Today's healthcare provider: Jon Eva, MD   Chief Complaint  Patient presents with   Annual Exam    Last completed 10/20/23 Diet - Well balanced, consuming two to three meals daily Exercise - two to three days a week for at least 20 minutes Feeling - well Sleeping - fairly well Concerns -  none   Subjective    Rhonda Cole is a 28 y.o. female who presents today for a complete physical exam.   Discussed the use of AI scribe software for clinical note transcription with the patient, who gave verbal consent to proceed.  History of Present Illness   Rhonda Cole is a 28 year old female who presents for an annual physical exam.  She was diagnosed with obsessive-compulsive disorder three months ago and is under psychiatric care. She switched from Zoloft  to Viibryd, resulting in significant symptom improvement.  She exercises two to three times a week, primarily using a stationary bike. She experiences prolonged facial redness post-exercise and occasionally tastes blood in her mouth with overexertion. She is cautious to avoid these symptoms.  She has gained approximately thirteen pounds since April, attributing this to a change in psychiatric medication and lifestyle changes. She is concerned about the weight gain's impact on her blood pressure and is working on balancing exercise with body image concerns.  She takes nortriptyline  for migraines, which is effective, with only low-grade headaches occurring about once a week, managed with Tylenol , hydration, or rest. She uses Nurtec about once a month for more severe headaches.  In July, she tested positive for both flu and COVID-19 but has since recovered. She plans to receive a flu shot today.        Last depression screening scores    11/10/2024    2:07 PM 04/11/2024    2:15 PM 02/01/2024    10:16 AM  PHQ 2/9 Scores  PHQ - 2 Score 2 4 2   PHQ- 9 Score 10 13  10       Data saved with a previous flowsheet row definition   Last fall risk screening    11/10/2024    2:07 PM  Fall Risk   Falls in the past year? 0  Number falls in past yr: 0  Injury with Fall? 0  Risk for fall due to : No Fall Risks  Follow up Falls evaluation completed        Medications: Outpatient Medications Prior to Visit  Medication Sig   hydrOXYzine  (ATARAX ) 25 MG tablet take 1-2 tablets by mouth at bedtime if needed for anxiety   norgestimate -ethinyl estradiol  (MILI) 0.25-35 MG-MCG tablet Take 1 tablet by mouth daily.   nortriptyline  (PAMELOR ) 25 MG capsule TAKE ONE CAPSULE BY MOUTH AT BEDTIME   propranolol (INDERAL) 10 MG tablet Take 5 mg by mouth 3 (three) times daily as needed.   Rimegepant Sulfate (NURTEC) 75 MG TBDP Take 1 tablet (75 mg total) by mouth every other day as needed.   Vilazodone HCl (VIIBRYD) 40 MG TABS Take 40 mg by mouth daily.   [DISCONTINUED] doxycycline  (VIBRAMYCIN ) 100 MG capsule Take 1 capsule (100 mg total) by mouth 2 (two) times daily. (Patient not taking: Reported on 11/10/2024)   [DISCONTINUED] methocarbamol  (ROBAXIN ) 500 MG tablet Take 1 tablet (500 mg total) by mouth every 8 (eight) hours as needed for muscle spasms. (Patient not taking:  Reported on 11/10/2024)   [DISCONTINUED] promethazine  (PHENERGAN ) 25 MG tablet Take 0.5-1 tablets (12.5-25 mg total) by mouth every 8 (eight) hours as needed for nausea (migraine). (Patient not taking: Reported on 11/10/2024)   [DISCONTINUED] sertraline  (ZOLOFT ) 100 MG tablet Take 2 tablets (200 mg total) by mouth daily. (Patient not taking: Reported on 11/10/2024)   No facility-administered medications prior to visit.    Review of Systems    Objective    BP 122/70 (BP Location: Right Arm, Patient Position: Sitting, Cuff Size: Normal)   Pulse (!) 118   Ht 5' 8 (1.727 m)   Wt 157 lb 9.6 oz (71.5 kg)   SpO2 100%   BMI 23.96  kg/m    Physical Exam Vitals reviewed.  Constitutional:      General: She is not in acute distress.    Appearance: Normal appearance. She is well-developed. She is not diaphoretic.  HENT:     Head: Normocephalic and atraumatic.     Right Ear: Tympanic membrane, ear canal and external ear normal.     Left Ear: Tympanic membrane, ear canal and external ear normal.     Nose: Nose normal.     Mouth/Throat:     Mouth: Mucous membranes are moist.     Pharynx: Oropharynx is clear. No oropharyngeal exudate.  Eyes:     General: No scleral icterus.    Conjunctiva/sclera: Conjunctivae normal.     Pupils: Pupils are equal, round, and reactive to light.  Neck:     Thyroid : No thyromegaly.  Cardiovascular:     Rate and Rhythm: Normal rate and regular rhythm.     Heart sounds: Normal heart sounds. No murmur heard. Pulmonary:     Effort: Pulmonary effort is normal. No respiratory distress.     Breath sounds: Normal breath sounds. No wheezing or rales.  Abdominal:     General: There is no distension.     Palpations: Abdomen is soft.     Tenderness: There is no abdominal tenderness.  Musculoskeletal:        General: No deformity.     Cervical back: Neck supple.     Right lower leg: No edema.     Left lower leg: No edema.  Lymphadenopathy:     Cervical: No cervical adenopathy.  Skin:    General: Skin is warm and dry.     Findings: No rash.  Neurological:     Mental Status: She is alert and oriented to person, place, and time. Mental status is at baseline.     Gait: Gait normal.  Psychiatric:        Mood and Affect: Mood normal.        Behavior: Behavior normal.        Thought Content: Thought content normal.      No results found for any visits on 11/10/24.  Assessment & Plan    Routine Health Maintenance and Physical Exam  Exercise Activities and Dietary recommendations  Goals   None     Immunization History  Administered Date(s) Administered   DTaP 09/11/1998,  11/07/1998, 01/09/1999, 01/10/2000, 08/01/2003   HIB (PRP-OMP) 09/11/1998, 11/07/1998, 01/09/1999, 01/10/2000   HPV 9-valent 10/20/2023, 12/17/2023, 04/19/2024   Hepatitis A 07/10/2009, 02/06/2011   Hepatitis B 09/11/1998, 11/07/1998, 04/18/1999   IPV 09/11/1998, 11/07/1998, 01/09/1999, 08/01/2003   Influenza, Seasonal, Injecte, Preservative Fre 11/10/2024   Influenza-Unspecified 12/01/2017, 10/23/2021, 09/29/2023   MMR 07/09/1999, 08/01/2003   Meningococcal Conjugate 02/06/2011, 06/13/2015   PFIZER(Purple Top)SARS-COV-2 Vaccination 02/09/2020, 03/05/2020  PPD Test 05/30/2022   Pneumococcal-Unspecified 10/16/1999, 01/10/2000   Td 07/10/2009   Tdap 07/10/2009, 12/01/2017   Varicella 07/09/1999, 07/10/2009    Health Maintenance  Topic Date Due   HIV Screening  Never done   Hepatitis C Screening  Never done   COVID-19 Vaccine (3 - 2025-26 season) 08/29/2024   Cervical Cancer Screening (Pap smear)  01/16/2026   DTaP/Tdap/Td (9 - Td or Tdap) 12/02/2027   Influenza Vaccine  Completed   Hepatitis B Vaccines 19-59 Average Risk  Completed   HPV VACCINES  Completed   Pneumococcal Vaccine  Aged Out   Meningococcal B Vaccine  Aged Out    Discussed health benefits of physical activity, and encouraged her to engage in regular exercise appropriate for her age and condition.  Problem List Items Addressed This Visit       Cardiovascular and Mediastinum   Migraine without aura and without status migrainosus, not intractable   Relevant Medications   propranolol (INDERAL) 10 MG tablet   Vilazodone HCl (VIIBRYD) 40 MG TABS     Other   GAD (generalized anxiety disorder)   Relevant Medications   Vilazodone HCl (VIIBRYD) 40 MG TABS   OCD (obsessive compulsive disorder)   Other Visit Diagnoses       Encounter for annual physical exam    -  Primary     Immunization due       Relevant Orders   Flu vaccine trivalent PF, 6mos and older(Flulaval,Afluria,Fluarix,Fluzone) (Completed)            Adult Wellness Visit Routine adult wellness visit with no acute concerns. Blood pressure normalized during visit. Labs from this year are satisfactory. - Administered flu shot today - Will schedule physical exam in one year  Obsessive-compulsive disorder and generalized anxiety disorder Diagnosed with OCD three months ago. Currently on Viibryd, which has improved symptoms. Previously on Zoloft  for six to seven years. OCD and anxiety are interconnected, and controlling OCD helps with anxiety. - Continue Viibryd for OCD and anxiety management  Migraine without aura, not intractable Migraines well-controlled with nortriptyline . Experiences low-grade headaches once a week, managed with Tylenol  or hydration. Uses Nurtec once a month, indicating significant improvement. - Continue nortriptyline  for migraine prevention  Abnormal weight gain Weight gain of 13 pounds since April, possibly due to lifestyle changes, medication side effects, or metabolic changes. Currently within a healthy BMI range. Engaging in regular exercise and mindful of body image issues related to OCD. - Encouraged continued exercise and lifestyle modifications - Instructed to monitor weight and BMI  Elevated blood pressure (resolved during visit) Initial elevated blood pressure likely due to situational factors. Normalized to 122/70 during visit. No immediate need for antihypertensive medication. - Continue to monitor blood pressure - Encouraged diet and exercise modifications  General Health Maintenance Pap smear due in 2027. Discussed COVID vaccine and booster, but she is low risk and prefers to wait. HIV and hepatitis C screening planned for future labs. - Administered flu shot today - Plan for HIV and hepatitis C screening during next lab work - Continue routine health maintenance schedule       Return in about 1 year (around 11/10/2025) for CPE.     Jon Eva, MD  [facility] [phone] (phone) [phone] (fax)  [facility]

## 2024-11-29 ENCOUNTER — Other Ambulatory Visit: Payer: Self-pay

## 2024-11-29 ENCOUNTER — Other Ambulatory Visit: Payer: Self-pay | Admitting: Licensed Practical Nurse

## 2024-11-29 DIAGNOSIS — Z0000 Encounter for general adult medical examination without abnormal findings: Secondary | ICD-10-CM

## 2024-11-29 MED ORDER — NORGESTIMATE-ETH ESTRADIOL 0.25-35 MG-MCG PO TABS: 1.0000 | ORAL_TABLET | Freq: Every day | ORAL | 0 refills | Status: DC

## 2024-12-27 ENCOUNTER — Other Ambulatory Visit: Payer: Self-pay | Admitting: Licensed Practical Nurse

## 2024-12-27 DIAGNOSIS — Z0000 Encounter for general adult medical examination without abnormal findings: Secondary | ICD-10-CM

## 2025-01-20 ENCOUNTER — Telehealth: Payer: Self-pay

## 2025-01-20 DIAGNOSIS — Z0000 Encounter for general adult medical examination without abnormal findings: Secondary | ICD-10-CM

## 2025-01-20 MED ORDER — NORGESTIMATE-ETH ESTRADIOL 0.25-35 MG-MCG PO TABS: 1.0000 | ORAL_TABLET | Freq: Every day | ORAL | 0 refills | Status: AC

## 2025-01-20 NOTE — Telephone Encounter (Signed)
 Patient calling triage requesting [employer] RF. Had annual scheduled for Monday 01/23/25 but due to possible bad weather, office will be closed AM as of now. Her annual has been rescheduled to 03/02/25. RF to last until annual sent. Patient aware.

## 2025-01-23 ENCOUNTER — Ambulatory Visit: Admitting: Licensed Practical Nurse

## 2025-01-30 ENCOUNTER — Encounter: Payer: Self-pay | Admitting: Family Medicine

## 2025-03-02 ENCOUNTER — Ambulatory Visit: Admitting: Licensed Practical Nurse

## 2025-11-13 ENCOUNTER — Encounter: Admitting: Family Medicine
